# Patient Record
Sex: Male | Born: 1965
Health system: Southern US, Community
[De-identification: ages and names within clinical notes are randomized; demographics above are authoritative.]

## PROBLEM LIST (undated history)

## (undated) DIAGNOSIS — B20 Human immunodeficiency virus [HIV] disease: Secondary | ICD-10-CM

## (undated) DIAGNOSIS — B019 Varicella without complication: Secondary | ICD-10-CM

## (undated) DIAGNOSIS — Z21 Asymptomatic human immunodeficiency virus [HIV] infection status: Secondary | ICD-10-CM

## (undated) HISTORY — PX: WISDOM TOOTH EXTRACTION: SHX21

## (undated) HISTORY — DX: Asymptomatic human immunodeficiency virus (hiv) infection status: Z21

## (undated) HISTORY — DX: Human immunodeficiency virus (HIV) disease: B20

## (undated) HISTORY — DX: Varicella without complication: B01.9

---

## 1996-07-11 ENCOUNTER — Encounter (INDEPENDENT_AMBULATORY_CARE_PROVIDER_SITE_OTHER): Payer: Self-pay | Admitting: *Deleted

## 1996-07-11 LAB — CONVERTED CEMR LAB: CD4 Count: 20 microliters

## 1997-08-22 ENCOUNTER — Encounter: Admission: RE | Admit: 1997-08-22 | Discharge: 1997-08-22 | Payer: Self-pay | Admitting: Infectious Diseases

## 1997-09-07 ENCOUNTER — Encounter: Admission: RE | Admit: 1997-09-07 | Discharge: 1997-09-07 | Payer: Self-pay | Admitting: Infectious Diseases

## 1997-11-09 ENCOUNTER — Encounter: Admission: RE | Admit: 1997-11-09 | Discharge: 1997-11-09 | Payer: Self-pay | Admitting: Hematology and Oncology

## 1997-12-21 ENCOUNTER — Encounter: Admission: RE | Admit: 1997-12-21 | Discharge: 1997-12-21 | Payer: Self-pay | Admitting: Infectious Diseases

## 1998-01-04 ENCOUNTER — Encounter: Admission: RE | Admit: 1998-01-04 | Discharge: 1998-01-04 | Payer: Self-pay | Admitting: Infectious Diseases

## 1998-03-06 ENCOUNTER — Ambulatory Visit (HOSPITAL_COMMUNITY): Admission: RE | Admit: 1998-03-06 | Discharge: 1998-03-06 | Payer: Self-pay | Admitting: Infectious Diseases

## 1998-03-06 ENCOUNTER — Encounter: Admission: RE | Admit: 1998-03-06 | Discharge: 1998-03-06 | Payer: Self-pay | Admitting: Infectious Diseases

## 1998-04-12 ENCOUNTER — Encounter: Admission: RE | Admit: 1998-04-12 | Discharge: 1998-04-12 | Payer: Self-pay | Admitting: Infectious Diseases

## 1998-06-08 ENCOUNTER — Encounter: Admission: RE | Admit: 1998-06-08 | Discharge: 1998-06-08 | Payer: Self-pay | Admitting: Infectious Diseases

## 1998-06-08 ENCOUNTER — Ambulatory Visit (HOSPITAL_COMMUNITY): Admission: RE | Admit: 1998-06-08 | Discharge: 1998-06-08 | Payer: Self-pay | Admitting: Infectious Diseases

## 1998-06-21 ENCOUNTER — Encounter: Admission: RE | Admit: 1998-06-21 | Discharge: 1998-06-21 | Payer: Self-pay | Admitting: Infectious Diseases

## 1998-08-16 ENCOUNTER — Ambulatory Visit (HOSPITAL_COMMUNITY): Admission: RE | Admit: 1998-08-16 | Discharge: 1998-08-16 | Payer: Self-pay | Admitting: Infectious Diseases

## 1998-08-30 ENCOUNTER — Encounter: Admission: RE | Admit: 1998-08-30 | Discharge: 1998-08-30 | Payer: Self-pay | Admitting: Infectious Diseases

## 1998-11-30 ENCOUNTER — Encounter: Admission: RE | Admit: 1998-11-30 | Discharge: 1998-11-30 | Payer: Self-pay | Admitting: Infectious Diseases

## 1998-11-30 ENCOUNTER — Ambulatory Visit (HOSPITAL_COMMUNITY): Admission: RE | Admit: 1998-11-30 | Discharge: 1998-11-30 | Payer: Self-pay | Admitting: Infectious Diseases

## 1998-12-13 ENCOUNTER — Encounter: Admission: RE | Admit: 1998-12-13 | Discharge: 1998-12-13 | Payer: Self-pay | Admitting: Infectious Diseases

## 1999-03-14 ENCOUNTER — Ambulatory Visit (HOSPITAL_COMMUNITY): Admission: RE | Admit: 1999-03-14 | Discharge: 1999-03-14 | Payer: Self-pay | Admitting: Infectious Diseases

## 1999-03-14 ENCOUNTER — Encounter: Admission: RE | Admit: 1999-03-14 | Discharge: 1999-03-14 | Payer: Self-pay | Admitting: Infectious Diseases

## 1999-05-02 ENCOUNTER — Encounter: Admission: RE | Admit: 1999-05-02 | Discharge: 1999-05-02 | Payer: Self-pay | Admitting: Infectious Diseases

## 1999-06-06 ENCOUNTER — Encounter: Admission: RE | Admit: 1999-06-06 | Discharge: 1999-06-06 | Payer: Self-pay | Admitting: Infectious Diseases

## 1999-06-13 ENCOUNTER — Encounter: Admission: RE | Admit: 1999-06-13 | Discharge: 1999-06-13 | Payer: Self-pay | Admitting: Infectious Diseases

## 1999-06-27 ENCOUNTER — Encounter: Admission: RE | Admit: 1999-06-27 | Discharge: 1999-06-27 | Payer: Self-pay | Admitting: Infectious Diseases

## 1999-08-08 ENCOUNTER — Encounter: Admission: RE | Admit: 1999-08-08 | Discharge: 1999-08-08 | Payer: Self-pay | Admitting: Infectious Diseases

## 1999-10-03 ENCOUNTER — Encounter: Admission: RE | Admit: 1999-10-03 | Discharge: 1999-10-03 | Payer: Self-pay | Admitting: Infectious Diseases

## 1999-10-03 ENCOUNTER — Ambulatory Visit (HOSPITAL_COMMUNITY): Admission: RE | Admit: 1999-10-03 | Discharge: 1999-10-03 | Payer: Self-pay | Admitting: Infectious Diseases

## 1999-10-24 ENCOUNTER — Encounter: Admission: RE | Admit: 1999-10-24 | Discharge: 1999-10-24 | Payer: Self-pay | Admitting: Infectious Diseases

## 1999-12-26 ENCOUNTER — Encounter: Admission: RE | Admit: 1999-12-26 | Discharge: 1999-12-26 | Payer: Self-pay | Admitting: Infectious Diseases

## 1999-12-26 ENCOUNTER — Ambulatory Visit (HOSPITAL_COMMUNITY): Admission: RE | Admit: 1999-12-26 | Discharge: 1999-12-26 | Payer: Self-pay | Admitting: Infectious Diseases

## 2000-01-09 ENCOUNTER — Encounter: Admission: RE | Admit: 2000-01-09 | Discharge: 2000-01-09 | Payer: Self-pay | Admitting: Infectious Diseases

## 2000-03-19 ENCOUNTER — Encounter: Admission: RE | Admit: 2000-03-19 | Discharge: 2000-03-19 | Payer: Self-pay | Admitting: Infectious Diseases

## 2000-03-19 ENCOUNTER — Ambulatory Visit (HOSPITAL_COMMUNITY): Admission: RE | Admit: 2000-03-19 | Discharge: 2000-03-19 | Payer: Self-pay | Admitting: Infectious Diseases

## 2001-02-10 ENCOUNTER — Ambulatory Visit (HOSPITAL_COMMUNITY): Admission: RE | Admit: 2001-02-10 | Discharge: 2001-02-10 | Payer: Self-pay | Admitting: Infectious Diseases

## 2001-02-10 ENCOUNTER — Encounter: Admission: RE | Admit: 2001-02-10 | Discharge: 2001-02-10 | Payer: Self-pay | Admitting: Infectious Diseases

## 2001-02-24 ENCOUNTER — Encounter: Admission: RE | Admit: 2001-02-24 | Discharge: 2001-02-24 | Payer: Self-pay | Admitting: Infectious Diseases

## 2001-03-16 ENCOUNTER — Ambulatory Visit (HOSPITAL_COMMUNITY): Admission: RE | Admit: 2001-03-16 | Discharge: 2001-03-16 | Payer: Self-pay | Admitting: Infectious Diseases

## 2001-03-16 ENCOUNTER — Encounter: Admission: RE | Admit: 2001-03-16 | Discharge: 2001-03-16 | Payer: Self-pay | Admitting: Infectious Diseases

## 2001-04-14 ENCOUNTER — Encounter: Admission: RE | Admit: 2001-04-14 | Discharge: 2001-04-14 | Payer: Self-pay | Admitting: Infectious Diseases

## 2001-10-19 ENCOUNTER — Ambulatory Visit (HOSPITAL_COMMUNITY): Admission: RE | Admit: 2001-10-19 | Discharge: 2001-10-19 | Payer: Self-pay | Admitting: Infectious Diseases

## 2001-10-19 ENCOUNTER — Encounter: Admission: RE | Admit: 2001-10-19 | Discharge: 2001-10-19 | Payer: Self-pay | Admitting: Infectious Diseases

## 2001-11-02 ENCOUNTER — Encounter: Admission: RE | Admit: 2001-11-02 | Discharge: 2001-11-02 | Payer: Self-pay | Admitting: Infectious Diseases

## 2001-12-09 ENCOUNTER — Encounter: Admission: RE | Admit: 2001-12-09 | Discharge: 2001-12-09 | Payer: Self-pay | Admitting: Internal Medicine

## 2002-12-06 ENCOUNTER — Encounter: Admission: RE | Admit: 2002-12-06 | Discharge: 2002-12-06 | Payer: Self-pay | Admitting: Infectious Diseases

## 2002-12-06 ENCOUNTER — Encounter (INDEPENDENT_AMBULATORY_CARE_PROVIDER_SITE_OTHER): Payer: Self-pay | Admitting: Infectious Diseases

## 2002-12-06 ENCOUNTER — Ambulatory Visit (HOSPITAL_COMMUNITY): Admission: RE | Admit: 2002-12-06 | Discharge: 2002-12-06 | Payer: Self-pay | Admitting: Infectious Diseases

## 2002-12-20 ENCOUNTER — Encounter: Admission: RE | Admit: 2002-12-20 | Discharge: 2002-12-20 | Payer: Self-pay | Admitting: Infectious Diseases

## 2003-03-21 ENCOUNTER — Encounter: Admission: RE | Admit: 2003-03-21 | Discharge: 2003-03-21 | Payer: Self-pay | Admitting: Infectious Diseases

## 2003-03-21 ENCOUNTER — Ambulatory Visit (HOSPITAL_COMMUNITY): Admission: RE | Admit: 2003-03-21 | Discharge: 2003-03-21 | Payer: Self-pay | Admitting: Infectious Diseases

## 2003-03-21 ENCOUNTER — Encounter (INDEPENDENT_AMBULATORY_CARE_PROVIDER_SITE_OTHER): Payer: Self-pay | Admitting: Infectious Diseases

## 2003-04-04 ENCOUNTER — Encounter: Admission: RE | Admit: 2003-04-04 | Discharge: 2003-04-04 | Payer: Self-pay | Admitting: Infectious Diseases

## 2004-07-31 ENCOUNTER — Encounter (INDEPENDENT_AMBULATORY_CARE_PROVIDER_SITE_OTHER): Payer: Self-pay | Admitting: *Deleted

## 2004-07-31 ENCOUNTER — Ambulatory Visit: Payer: Self-pay | Admitting: Infectious Diseases

## 2004-07-31 ENCOUNTER — Ambulatory Visit (HOSPITAL_COMMUNITY): Admission: RE | Admit: 2004-07-31 | Discharge: 2004-07-31 | Payer: Self-pay | Admitting: Infectious Diseases

## 2004-07-31 LAB — CONVERTED CEMR LAB: CD4 Count: 70 microliters

## 2004-08-13 ENCOUNTER — Ambulatory Visit: Payer: Self-pay | Admitting: Infectious Diseases

## 2004-10-23 ENCOUNTER — Ambulatory Visit: Payer: Self-pay | Admitting: Infectious Diseases

## 2004-10-23 ENCOUNTER — Encounter (INDEPENDENT_AMBULATORY_CARE_PROVIDER_SITE_OTHER): Payer: Self-pay | Admitting: *Deleted

## 2004-10-23 ENCOUNTER — Ambulatory Visit (HOSPITAL_COMMUNITY): Admission: RE | Admit: 2004-10-23 | Discharge: 2004-10-23 | Payer: Self-pay | Admitting: Infectious Diseases

## 2004-10-23 LAB — CONVERTED CEMR LAB
CD4 Count: 160 microliters
HIV 1 RNA Quant: 399 copies/mL

## 2004-11-06 ENCOUNTER — Ambulatory Visit: Payer: Self-pay | Admitting: Infectious Diseases

## 2006-01-01 ENCOUNTER — Encounter: Admission: RE | Admit: 2006-01-01 | Discharge: 2006-01-01 | Payer: Self-pay | Admitting: Infectious Diseases

## 2006-01-01 ENCOUNTER — Ambulatory Visit: Payer: Self-pay | Admitting: Infectious Diseases

## 2006-01-01 ENCOUNTER — Encounter (INDEPENDENT_AMBULATORY_CARE_PROVIDER_SITE_OTHER): Payer: Self-pay | Admitting: *Deleted

## 2006-01-01 LAB — CONVERTED CEMR LAB: HIV 1 RNA Quant: 118000 copies/mL

## 2006-01-27 ENCOUNTER — Ambulatory Visit (HOSPITAL_COMMUNITY): Admission: RE | Admit: 2006-01-27 | Discharge: 2006-01-27 | Payer: Self-pay | Admitting: Infectious Diseases

## 2006-01-27 ENCOUNTER — Ambulatory Visit: Payer: Self-pay | Admitting: Infectious Diseases

## 2006-01-29 ENCOUNTER — Ambulatory Visit: Payer: Self-pay | Admitting: Infectious Diseases

## 2006-02-05 ENCOUNTER — Ambulatory Visit: Payer: Self-pay | Admitting: Infectious Diseases

## 2006-06-17 ENCOUNTER — Encounter (INDEPENDENT_AMBULATORY_CARE_PROVIDER_SITE_OTHER): Payer: Self-pay | Admitting: *Deleted

## 2006-06-17 ENCOUNTER — Encounter: Admission: RE | Admit: 2006-06-17 | Discharge: 2006-06-17 | Payer: Self-pay | Admitting: Infectious Diseases

## 2006-06-17 ENCOUNTER — Ambulatory Visit: Payer: Self-pay | Admitting: Infectious Diseases

## 2006-06-17 LAB — CONVERTED CEMR LAB
Albumin: 4.3 g/dL (ref 3.5–5.2)
Alkaline Phosphatase: 67 units/L (ref 39–117)
Bilirubin Urine: NEGATIVE
CO2: 26 meq/L (ref 19–32)
Chloride: 103 meq/L (ref 96–112)
Cholesterol: 152 mg/dL (ref 0–200)
Glucose, Bld: 81 mg/dL (ref 70–99)
HIV 1 RNA Quant: 523 copies/mL — ABNORMAL HIGH (ref <50)
HIV-1 RNA Quant, Log: 2.72 — ABNORMAL HIGH (ref <1.70)
LDL Cholesterol: 96 mg/dL (ref 0–99)
Lymphocytes Relative: 21 % (ref 12–46)
Lymphs Abs: 1.4 10*3/uL (ref 0.7–3.3)
Neutro Abs: 4.4 10*3/uL (ref 1.7–7.7)
Neutrophils Relative %: 67 % (ref 43–77)
Platelets: 195 10*3/uL (ref 150–400)
Potassium: 4.1 meq/L (ref 3.5–5.3)
Sodium: 141 meq/L (ref 135–145)
Specific Gravity, Urine: 1.015 (ref 1.005–1.03)
Total Protein: 7.1 g/dL (ref 6.0–8.3)
Triglycerides: 119 mg/dL (ref <150)
Urobilinogen, UA: 1 (ref 0.0–1.0)
WBC: 6.5 10*3/uL (ref 4.0–10.5)

## 2006-07-07 ENCOUNTER — Encounter (INDEPENDENT_AMBULATORY_CARE_PROVIDER_SITE_OTHER): Payer: Self-pay | Admitting: *Deleted

## 2006-07-07 LAB — CONVERTED CEMR LAB

## 2006-07-15 DIAGNOSIS — B351 Tinea unguium: Secondary | ICD-10-CM

## 2006-07-15 DIAGNOSIS — B1710 Acute hepatitis C without hepatic coma: Secondary | ICD-10-CM

## 2006-07-15 DIAGNOSIS — B20 Human immunodeficiency virus [HIV] disease: Secondary | ICD-10-CM | POA: Insufficient documentation

## 2006-07-15 DIAGNOSIS — Z8709 Personal history of other diseases of the respiratory system: Secondary | ICD-10-CM | POA: Insufficient documentation

## 2006-07-15 DIAGNOSIS — K130 Diseases of lips: Secondary | ICD-10-CM

## 2006-07-15 DIAGNOSIS — B370 Candidal stomatitis: Secondary | ICD-10-CM

## 2006-07-20 ENCOUNTER — Encounter (INDEPENDENT_AMBULATORY_CARE_PROVIDER_SITE_OTHER): Payer: Self-pay | Admitting: *Deleted

## 2006-07-21 ENCOUNTER — Ambulatory Visit: Payer: Self-pay | Admitting: Infectious Diseases

## 2006-10-24 ENCOUNTER — Encounter (INDEPENDENT_AMBULATORY_CARE_PROVIDER_SITE_OTHER): Payer: Self-pay | Admitting: Infectious Diseases

## 2006-10-24 ENCOUNTER — Ambulatory Visit: Payer: Self-pay | Admitting: Internal Medicine

## 2006-10-24 ENCOUNTER — Encounter: Admission: RE | Admit: 2006-10-24 | Discharge: 2006-10-24 | Payer: Self-pay | Admitting: Infectious Diseases

## 2006-11-03 ENCOUNTER — Encounter (INDEPENDENT_AMBULATORY_CARE_PROVIDER_SITE_OTHER): Payer: Self-pay | Admitting: *Deleted

## 2006-11-10 ENCOUNTER — Ambulatory Visit: Payer: Self-pay | Admitting: Infectious Diseases

## 2006-11-10 ENCOUNTER — Encounter: Payer: Self-pay | Admitting: Internal Medicine

## 2006-12-08 ENCOUNTER — Telehealth: Payer: Self-pay | Admitting: Infectious Disease

## 2006-12-23 ENCOUNTER — Ambulatory Visit: Payer: Self-pay | Admitting: Internal Medicine

## 2007-01-22 ENCOUNTER — Encounter: Admission: RE | Admit: 2007-01-22 | Discharge: 2007-01-22 | Payer: Self-pay | Admitting: Internal Medicine

## 2007-01-22 ENCOUNTER — Ambulatory Visit: Payer: Self-pay | Admitting: Internal Medicine

## 2007-01-22 LAB — CONVERTED CEMR LAB
ALT: 23 units/L (ref 0–53)
AST: 21 units/L (ref 0–37)
Albumin: 4.6 g/dL (ref 3.5–5.2)
Alkaline Phosphatase: 79 units/L (ref 39–117)
BUN: 14 mg/dL (ref 6–23)
Basophils Absolute: 0.1 10*3/uL (ref 0.0–0.1)
Basophils Relative: 1 % (ref 0–1)
CO2: 23 meq/L (ref 19–32)
Calcium: 9 mg/dL (ref 8.4–10.5)
Chloride: 105 meq/L (ref 96–112)
Creatinine, Ser: 1.04 mg/dL (ref 0.40–1.50)
Eosinophils Absolute: 0.2 10*3/uL (ref 0.0–0.7)
Eosinophils Relative: 3 % (ref 0–5)
Glucose, Bld: 76 mg/dL (ref 70–99)
HCT: 43.2 % (ref 39.0–52.0)
HIV 1 RNA Quant: 528 copies/mL — ABNORMAL HIGH (ref <50)
HIV-1 RNA Quant, Log: 2.72 — ABNORMAL HIGH (ref <1.70)
Hemoglobin: 15.7 g/dL (ref 13.0–17.0)
Lymphocytes Relative: 25 % (ref 12–46)
Lymphs Abs: 1.5 10*3/uL (ref 0.7–3.3)
MCHC: 36.3 g/dL — ABNORMAL HIGH (ref 30.0–36.0)
MCV: 92.1 fL (ref 78.0–100.0)
Monocytes Absolute: 0.5 10*3/uL (ref 0.2–0.7)
Monocytes Relative: 8 % (ref 3–11)
Neutro Abs: 3.9 10*3/uL (ref 1.7–7.7)
Neutrophils Relative %: 64 % (ref 43–77)
Platelets: 206 10*3/uL (ref 150–400)
Potassium: 3.9 meq/L (ref 3.5–5.3)
RBC: 4.69 M/uL (ref 4.22–5.81)
RDW: 13.7 % (ref 11.5–14.0)
Sodium: 139 meq/L (ref 135–145)
Total Bilirubin: 0.5 mg/dL (ref 0.3–1.2)
Total Protein: 7 g/dL (ref 6.0–8.3)
WBC: 6.1 10*3/uL (ref 4.0–10.5)

## 2007-02-06 ENCOUNTER — Ambulatory Visit: Payer: Self-pay | Admitting: Internal Medicine

## 2007-10-13 ENCOUNTER — Encounter: Admission: RE | Admit: 2007-10-13 | Discharge: 2007-10-13 | Payer: Self-pay | Admitting: Internal Medicine

## 2007-10-13 ENCOUNTER — Ambulatory Visit: Payer: Self-pay | Admitting: Internal Medicine

## 2007-10-13 LAB — CONVERTED CEMR LAB
AST: 28 units/L (ref 0–37)
Albumin: 4 g/dL (ref 3.5–5.2)
BUN: 15 mg/dL (ref 6–23)
CO2: 24 meq/L (ref 19–32)
Calcium: 8.6 mg/dL (ref 8.4–10.5)
Chloride: 108 meq/L (ref 96–112)
Creatinine, Ser: 1.06 mg/dL (ref 0.40–1.50)
Eosinophils Relative: 3 % (ref 0–5)
Glucose, Bld: 76 mg/dL (ref 70–99)
HCT: 38.1 % — ABNORMAL LOW (ref 39.0–52.0)
HIV-1 RNA Quant, Log: 5.09 — ABNORMAL HIGH (ref <1.70)
Hemoglobin: 13.4 g/dL (ref 13.0–17.0)
Lymphocytes Relative: 32 % (ref 12–46)
Lymphs Abs: 1.1 10*3/uL (ref 0.7–4.0)
Monocytes Absolute: 0.5 10*3/uL (ref 0.1–1.0)
Monocytes Relative: 14 % — ABNORMAL HIGH (ref 3–12)
Neutro Abs: 1.6 10*3/uL — ABNORMAL LOW (ref 1.7–7.7)
Potassium: 4 meq/L (ref 3.5–5.3)
RBC: 4.09 M/uL — ABNORMAL LOW (ref 4.22–5.81)
RDW: 13.8 % (ref 11.5–15.5)
Testosterone: 288.11 ng/dL — ABNORMAL LOW (ref 350–890)
WBC: 3.3 10*3/uL — ABNORMAL LOW (ref 4.0–10.5)

## 2007-10-27 ENCOUNTER — Ambulatory Visit: Payer: Self-pay | Admitting: Internal Medicine

## 2007-10-27 DIAGNOSIS — R599 Enlarged lymph nodes, unspecified: Secondary | ICD-10-CM | POA: Insufficient documentation

## 2007-10-27 LAB — CONVERTED CEMR LAB

## 2007-11-20 ENCOUNTER — Ambulatory Visit: Payer: Self-pay | Admitting: Internal Medicine

## 2007-12-04 ENCOUNTER — Ambulatory Visit: Payer: Self-pay | Admitting: Internal Medicine

## 2008-02-12 IMAGING — CR DG CHEST 2V
2 series · 2 of 2 positions shown · non-contrast
Comparison: none

CLINICAL DATA: Productive cough, low grade fever for ten days.
 CHEST - 2 VIEWS:

[w chest pa]
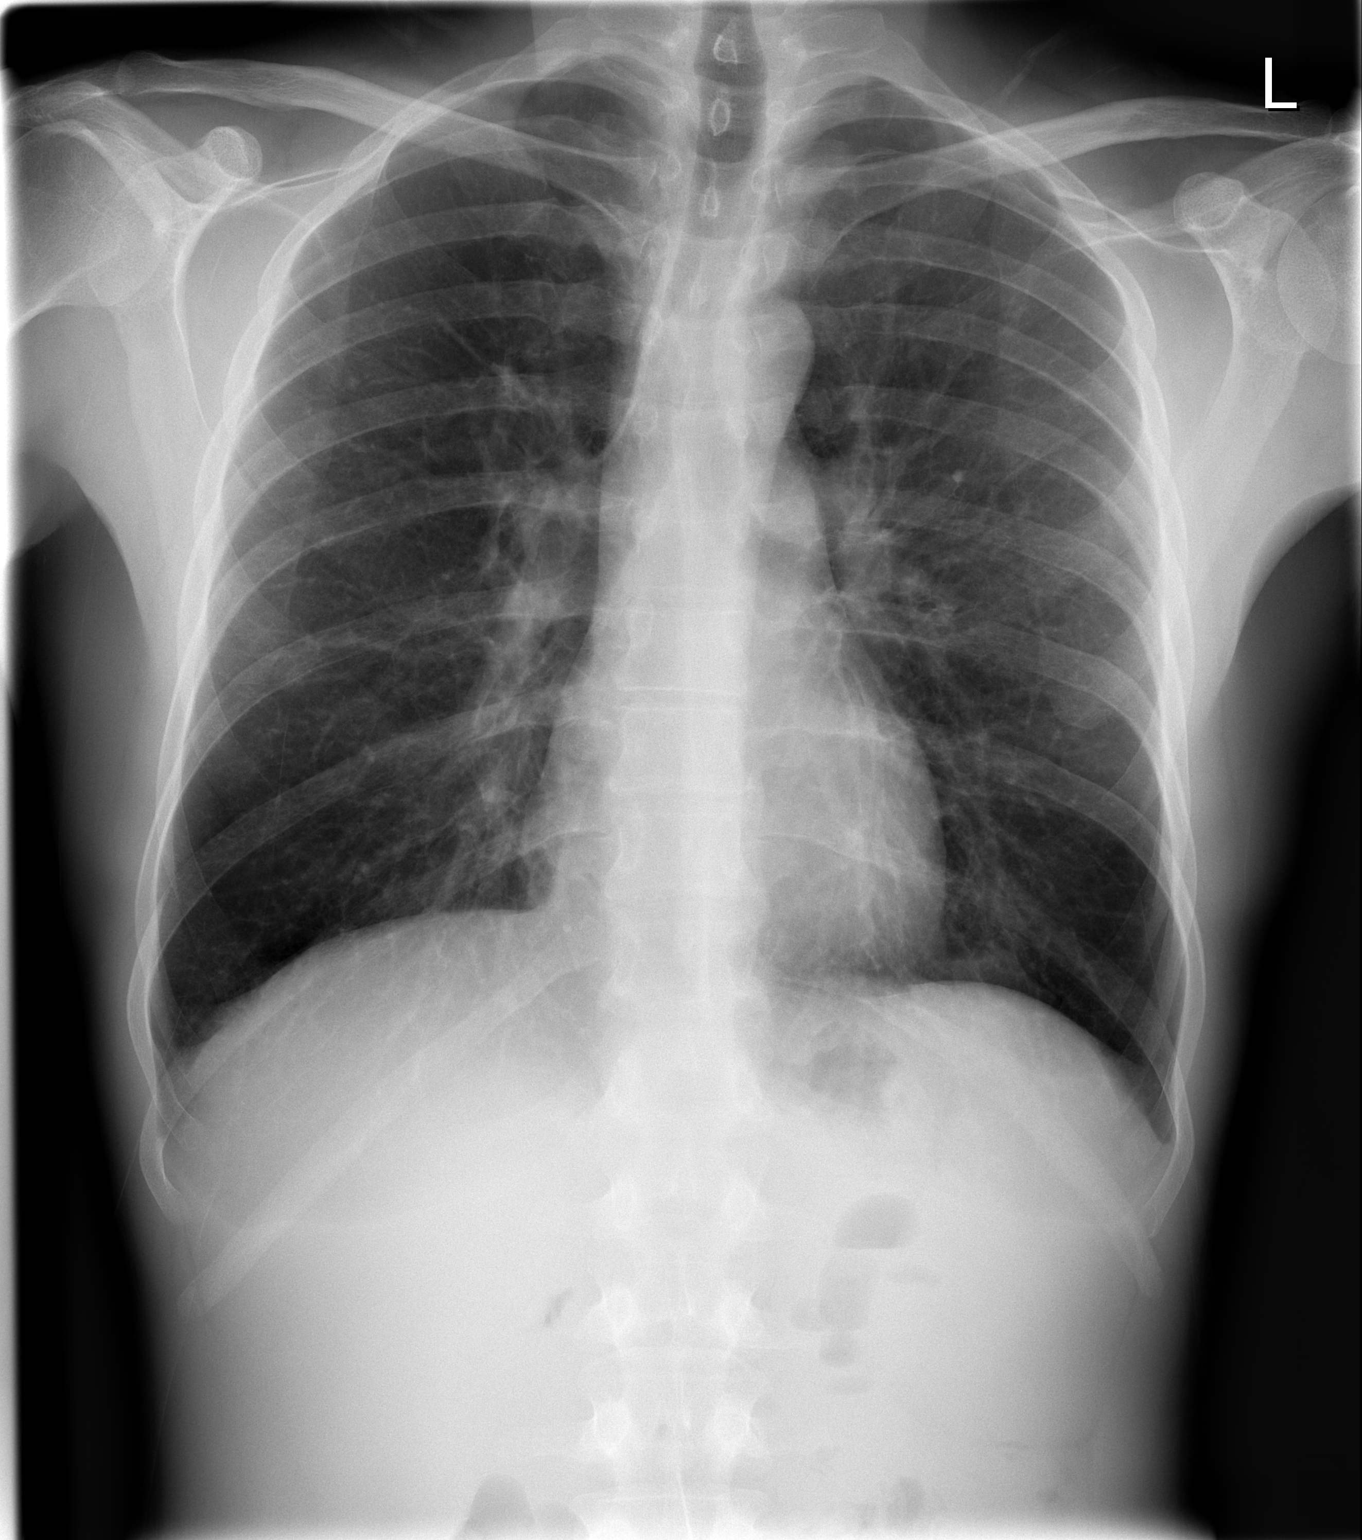

[w chest lat]
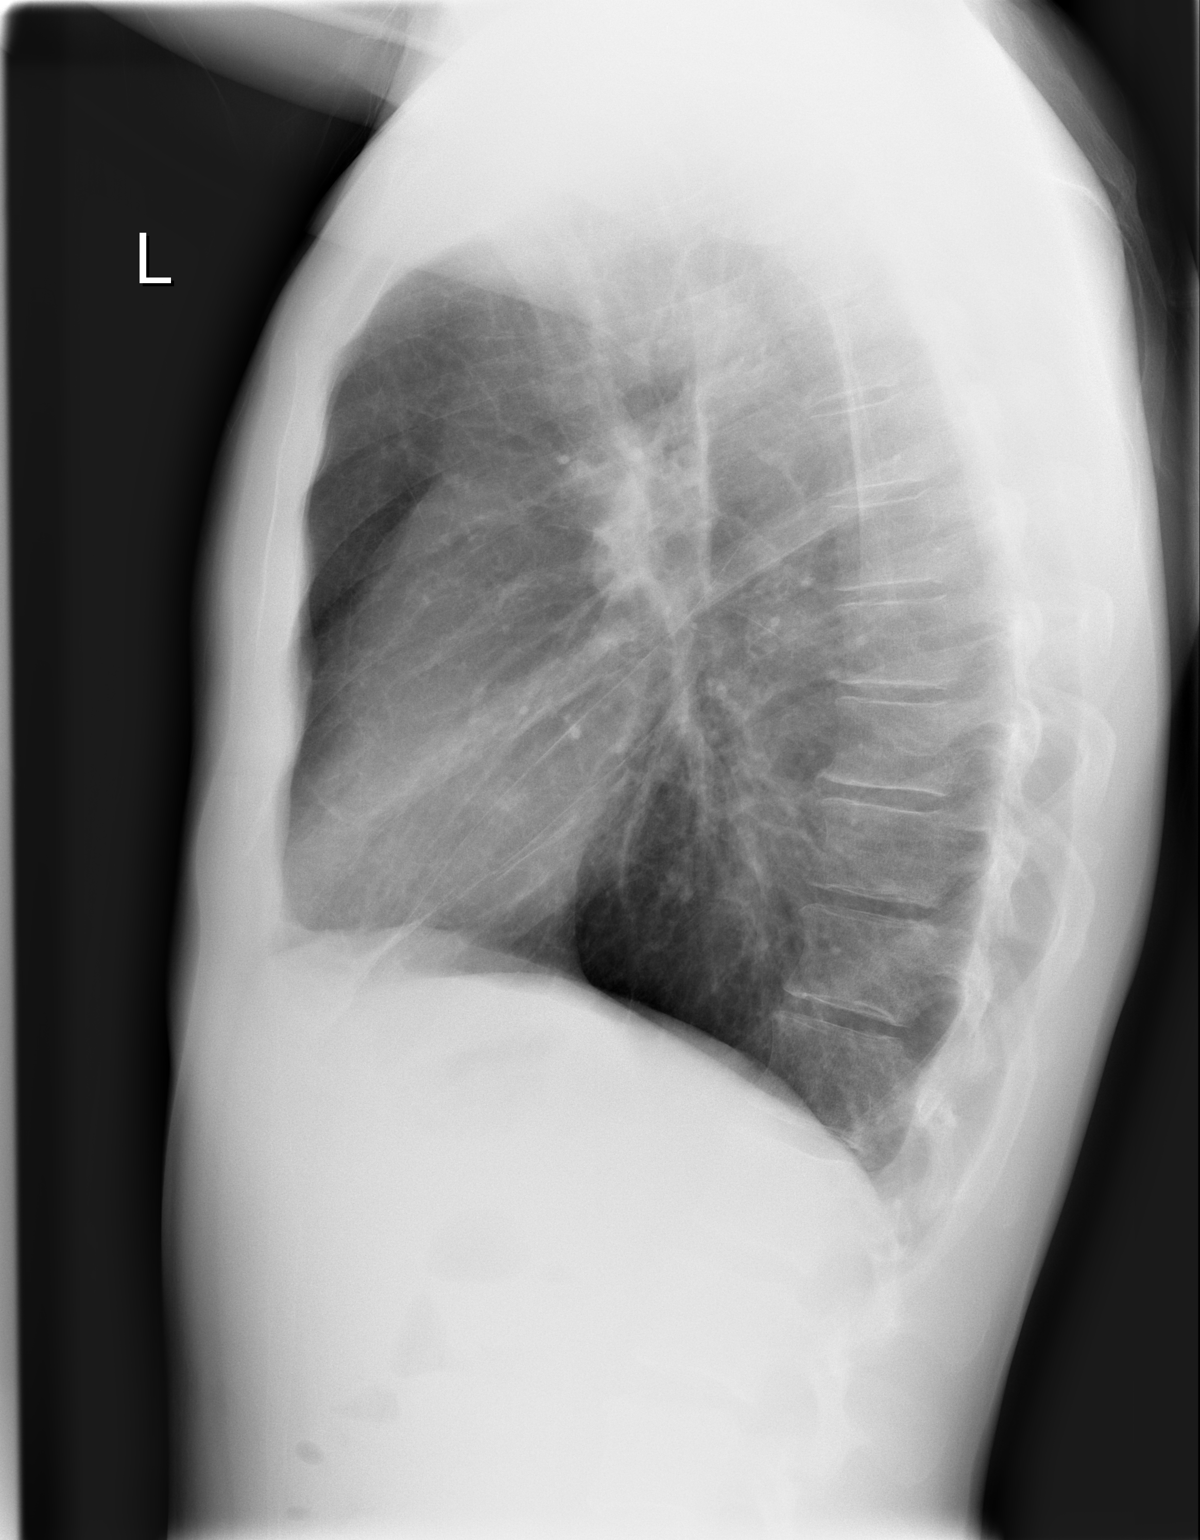

[2 of 2 positions shown; findings below may reference images not displayed]

FINDINGS: The heart size is normal.   The mediastinum is unremarkable.  The lungs are clear.   No effusions.  No soft tissue or bony abnormalities.
IMPRESSION: No active disease.

## 2008-08-19 ENCOUNTER — Ambulatory Visit: Payer: Self-pay | Admitting: Internal Medicine

## 2008-08-19 LAB — CONVERTED CEMR LAB
ALT: 19 units/L (ref 0–53)
AST: 21 units/L (ref 0–37)
BUN: 14 mg/dL (ref 6–23)
CO2: 23 meq/L (ref 19–32)
Calcium: 9.3 mg/dL (ref 8.4–10.5)
Chloride: 106 meq/L (ref 96–112)
Creatinine, Ser: 1.14 mg/dL (ref 0.40–1.50)
GFR calc Af Amer: >60 mL/min (ref >60)
HIV 1 RNA Quant: 1140 copies/mL — ABNORMAL HIGH (ref <48)
HIV-1 RNA Quant, Log: 3.06 — ABNORMAL HIGH (ref <1.68)
Hemoglobin: 16.7 g/dL (ref 13.0–17.0)
Lymphs Abs: 2.1 10*3/uL (ref 0.7–4.0)
Monocytes Absolute: 0.9 10*3/uL (ref 0.1–1.0)
Monocytes Relative: 10 % (ref 3–12)
Neutro Abs: 5.9 10*3/uL (ref 1.7–7.7)
Neutrophils Relative %: 65 % (ref 43–77)
RBC: 5.12 M/uL (ref 4.22–5.81)
Total Bilirubin: 0.4 mg/dL (ref 0.3–1.2)
WBC: 9.2 10*3/uL (ref 4.0–10.5)

## 2008-09-02 ENCOUNTER — Ambulatory Visit: Payer: Self-pay | Admitting: Internal Medicine

## 2008-09-16 ENCOUNTER — Telehealth (INDEPENDENT_AMBULATORY_CARE_PROVIDER_SITE_OTHER): Payer: Self-pay | Admitting: *Deleted

## 2008-12-02 ENCOUNTER — Ambulatory Visit: Payer: Self-pay | Admitting: Internal Medicine

## 2008-12-02 LAB — CONVERTED CEMR LAB
ALT: 15 units/L (ref 0–53)
AST: 17 units/L (ref 0–37)
Albumin: 4.3 g/dL (ref 3.5–5.2)
Alkaline Phosphatase: 68 units/L (ref 39–117)
Basophils Absolute: 0 10*3/uL (ref 0.0–0.1)
Basophils Relative: 1 % (ref 0–1)
Chloride: 106 meq/L (ref 96–112)
Hemoglobin: 15.9 g/dL (ref 13.0–17.0)
MCHC: 34.9 g/dL (ref 30.0–36.0)
Monocytes Absolute: 0.6 10*3/uL (ref 0.1–1.0)
Neutro Abs: 4.7 10*3/uL (ref 1.7–7.7)
Neutrophils Relative %: 65 % (ref 43–77)
Platelets: 151 10*3/uL (ref 150–400)
Potassium: 3.9 meq/L (ref 3.5–5.3)
RDW: 13.9 % (ref 11.5–15.5)
Sodium: 140 meq/L (ref 135–145)
Total Protein: 6.7 g/dL (ref 6.0–8.3)

## 2008-12-16 ENCOUNTER — Ambulatory Visit: Payer: Self-pay | Admitting: Internal Medicine

## 2009-01-11 ENCOUNTER — Ambulatory Visit: Payer: Self-pay | Admitting: Internal Medicine

## 2009-02-22 ENCOUNTER — Ambulatory Visit: Payer: Self-pay | Admitting: Internal Medicine

## 2009-02-22 LAB — CONVERTED CEMR LAB
BUN: 14 mg/dL (ref 6–23)
CO2: 22 meq/L (ref 19–32)
Calcium: 8.8 mg/dL (ref 8.4–10.5)
Chloride: 106 meq/L (ref 96–112)
Cholesterol: 167 mg/dL (ref 0–200)
Creatinine, Ser: 1.05 mg/dL (ref 0.40–1.50)
Eosinophils Absolute: 0.3 10*3/uL (ref 0.0–0.7)
Eosinophils Relative: 4 % (ref 0–5)
HCT: 46.8 % (ref 39.0–52.0)
HDL: 25 mg/dL — ABNORMAL LOW (ref >39)
Lymphs Abs: 2 10*3/uL (ref 0.7–4.0)
MCV: 97.5 fL (ref >=78.0)
Monocytes Relative: 10 % (ref 3–12)
Platelets: 164 10*3/uL (ref 150–400)
Total CHOL/HDL Ratio: 6.7
WBC: 6.5 10*3/uL (ref 4.0–10.5)

## 2009-03-15 ENCOUNTER — Ambulatory Visit: Payer: Self-pay | Admitting: Internal Medicine

## 2009-06-14 ENCOUNTER — Ambulatory Visit: Payer: Self-pay | Admitting: Internal Medicine

## 2009-06-14 LAB — CONVERTED CEMR LAB
Albumin: 4.2 g/dL (ref 3.5–5.2)
Alkaline Phosphatase: 63 units/L (ref 39–117)
BUN: 13 mg/dL (ref 6–23)
Basophils Absolute: 0 10*3/uL (ref 0.0–0.1)
Basophils Relative: 1 % (ref 0–1)
Eosinophils Relative: 2 % (ref 0–5)
Glucose, Bld: 96 mg/dL (ref 70–99)
HCT: 44.9 % (ref 39.0–52.0)
HIV 1 RNA Quant: 454 copies/mL — ABNORMAL HIGH (ref <48)
HIV-1 RNA Quant, Log: 2.66 — ABNORMAL HIGH (ref <1.68)
Hemoglobin: 15.6 g/dL (ref 13.0–17.0)
Lymphocytes Relative: 21 % (ref 12–46)
MCHC: 34.7 g/dL (ref 30.0–36.0)
Monocytes Absolute: 0.8 10*3/uL (ref 0.1–1.0)
Potassium: 4.1 meq/L (ref 3.5–5.3)
RDW: 13.5 % (ref 11.5–15.5)

## 2009-06-28 ENCOUNTER — Ambulatory Visit: Payer: Self-pay | Admitting: Internal Medicine

## 2009-09-26 ENCOUNTER — Ambulatory Visit: Payer: Self-pay | Admitting: Internal Medicine

## 2009-09-26 LAB — CONVERTED CEMR LAB
ALT: 15 units/L (ref 0–53)
AST: 18 units/L (ref 0–37)
Albumin: 4.5 g/dL (ref 3.5–5.2)
Alkaline Phosphatase: 70 units/L (ref 39–117)
Calcium: 9.7 mg/dL (ref 8.4–10.5)
Chloride: 103 meq/L (ref 96–112)
Eosinophils Relative: 3 % (ref 0–5)
HCT: 47.9 % (ref 39.0–52.0)
Lymphocytes Relative: 23 % (ref 12–46)
Lymphs Abs: 1.8 10*3/uL (ref 0.7–4.0)
Neutro Abs: 5.6 10*3/uL (ref 1.7–7.7)
Neutrophils Relative %: 69 % (ref 43–77)
Platelets: 201 10*3/uL (ref 150–400)
Potassium: 4.5 meq/L (ref 3.5–5.3)
Sodium: 139 meq/L (ref 135–145)
WBC: 8.1 10*3/uL (ref 4.0–10.5)

## 2009-10-11 ENCOUNTER — Ambulatory Visit: Payer: Self-pay | Admitting: Internal Medicine

## 2010-04-11 ENCOUNTER — Ambulatory Visit: Payer: Self-pay | Admitting: Internal Medicine

## 2010-04-11 LAB — CONVERTED CEMR LAB
Albumin: 4.6 g/dL (ref 3.5–5.2)
Alkaline Phosphatase: 76 units/L (ref 39–117)
BUN: 12 mg/dL (ref 6–23)
Basophils Relative: 0 % (ref 0–1)
CO2: 24 meq/L (ref 19–32)
Glucose, Bld: 89 mg/dL (ref 70–99)
HIV 1 RNA Quant: <20 copies/mL (ref <20)
HIV-1 RNA Quant, Log: <1.3 (ref <1.30)
Hemoglobin: 17.2 g/dL — ABNORMAL HIGH (ref 13.0–17.0)
Lymphs Abs: 1.9 10*3/uL (ref 0.7–4.0)
MCHC: 34.4 g/dL (ref 30.0–36.0)
MCV: 101.4 fL — ABNORMAL HIGH (ref 78.0–100.0)
Monocytes Absolute: 0.7 10*3/uL (ref 0.1–1.0)
Monocytes Relative: 7 % (ref 3–12)
Neutro Abs: 7.3 10*3/uL (ref 1.7–7.7)
Neutrophils Relative %: 72 % (ref 43–77)
RBC: 4.93 M/uL (ref 4.22–5.81)
Total Bilirubin: 0.6 mg/dL (ref 0.3–1.2)
WBC: 10.1 10*3/uL (ref 4.0–10.5)

## 2010-05-18 ENCOUNTER — Ambulatory Visit
Admission: RE | Admit: 2010-05-18 | Discharge: 2010-05-18 | Payer: Self-pay | Source: Home / Self Care | Attending: Internal Medicine | Admitting: Internal Medicine

## 2010-06-06 ENCOUNTER — Encounter (INDEPENDENT_AMBULATORY_CARE_PROVIDER_SITE_OTHER): Payer: Self-pay | Admitting: *Deleted

## 2010-06-10 LAB — CONVERTED CEMR LAB
ALT: 16 units/L (ref 0–53)
AST: 18 units/L (ref 0–37)
Albumin: 4.4 g/dL (ref 3.5–5.2)
Alkaline Phosphatase: 62 units/L (ref 39–117)
Basophils Absolute: 0 10*3/uL (ref 0.0–0.1)
Basophils Relative: 1 % (ref 0–1)
Calcium: 9.2 mg/dL (ref 8.4–10.5)
Chloride: 103 meq/L (ref 96–112)
MCHC: 35.7 g/dL (ref 30.0–36.0)
Neutro Abs: 4.5 10*3/uL (ref 1.7–7.7)
Neutrophils Relative %: 70 % (ref 43–77)
Platelets: 148 10*3/uL — ABNORMAL LOW (ref 150–400)
Potassium: 3.9 meq/L (ref 3.5–5.3)
RBC: 4.96 M/uL (ref 4.22–5.81)
RDW: 13.1 % (ref 11.5–14.0)
Sodium: 137 meq/L (ref 135–145)

## 2010-06-12 NOTE — Assessment & Plan Note (Signed)
Summary: 60month f/u/vs   CC:  follow-up visit and lab results.  History of Present Illness: Pt here for f/u.  H has been under some stress with his Mother hospitalized with an infection. No missed doses of his HIV meds.  Preventive Screening-Counseling & Management  Alcohol-Tobacco     Alcohol drinks/day: 1     Alcohol type: mixed drinks     Smoking Status: current     Smoking Cessation Counseling: yes     Packs/Day: 1.0     Passive Smoke Exposure: yes  Caffeine-Diet-Exercise     Caffeine use/day: coffee     Does Patient Exercise: yes     Type of exercise: gym      Exercise (avg: min/session): 30-60     Times/week: 3  Hep-HIV-STD-Contraception     HIV Risk: yes     HIV Risk Counseling: 11/06/2004  Safety-Violence-Falls     Seat Belt Use: yes      Sexual History:  currently monogamous.        Drug Use:  never.        Blood Transfusions:  no.        Travel History:  none.    Comments: pt. declined condoms   Updated Prior Medication List: VIREAD 300 MG  TABS (TENOFOVIR DISOPROXIL FUMARATE) Take 1 tablet by mouth once a day EPZICOM 600-300 MG  TABS (ABACAVIR SULFATE-LAMIVUDINE) Take 1 tablet by mouth once a day PREZISTA 600 MG TABS (DARUNAVIR ETHANOLATE) Take 1 tablet by mouth two times a day NORVIR 100 MG TABS (RITONAVIR) Take 1 tablet by mouth two times a day  Current Allergies (reviewed today): No known allergies  Past History:  Past Medical History: Last updated: 07/15/2006 HIV disease Pneumonia, hx of Hepatitis C Oral thrush Cheilosis Tinea ungium  Review of Systems  The patient denies anorexia, fever, and weight loss.    Vital Signs:  Patient profile:   45 year old male Height:      68 inches (172.72 cm) Weight:      136.12 pounds (61.87 kg) BMI:     20.77 Temp:     97.5 degrees F (36.39 degrees C) oral Pulse rate:   71 / minute BP sitting:   113 / 69  (right arm)  Vitals Entered By: Wendall Mola CMA Duncan Dull) (October 11, 2009 9:18  AM) CC: follow-up visit, lab results Is Patient Diabetic? No Pain Assessment Patient in pain? no      Nutritional Status BMI of 19 -24 = normal Nutritional Status Detail appetite "fine"  Does patient need assistance? Functional Status Self care Ambulation Normal Comments no missed doses of meds per patient   Physical Exam  General:  alert, well-developed, well-nourished, and well-hydrated.   Head:  normocephalic and atraumatic.   Mouth:  pharynx pink and moist.   Lungs:  normal breath sounds.      Impression & Recommendations:  Problem # 1:  HIV DISEASE (ICD-042) Pt.s most recent CD4ct was 210 and VL <48. This is the first time in several years that he is undetectable .  Pt instructed to continue the current antiretroviral regimen.  Pt encouraged to take medication regularly and not miss doses.  Pt will f/u in 3 months for repeat blood work and will see me 2 weeks later.  Diagnostics Reviewed:  HIV: CDC-defined AIDS (09/02/2008)   CD4: 210 (09/27/2009)   WBC: 8.1 (09/26/2009)   Hgb: 16.6 (09/26/2009)   HCT: 47.9 (09/26/2009)   Platelets: 201 (09/26/2009) HIV  genotype: * (12/16/2008)   HIV-1 RNA: <48 copies/mL (09/26/2009)   HBSAg: No (07/07/2006)  Other Orders: Est. Patient Level III (60454) Future Orders: T-CD4SP ([facility]) (CD4SP) ... 01/09/2010 T-HIV Viral Load [id number]) ... 01/09/2010 T-Comprehensive Metabolic Panel [phone]) ... 01/09/2010 T-CBC w/Diff ([id number]) ... 01/09/2010 T-Hepatitis A Antibody ([id number]) ... 01/09/2010  Patient Instructions: 1)  Please schedule a follow-up appointment in 3 months, 2 weeks after labs.

## 2010-06-12 NOTE — Assessment & Plan Note (Signed)
Summary: F/U/VS   CC:  f/u labs.  History of Present Illness: Pt doing well. No missed doses of his HIV meds.  Preventive Screening-Counseling & Management  Alcohol-Tobacco     Alcohol drinks/day: 1     Alcohol type: mixed drinks     Smoking Status: current     Smoking Cessation Counseling: yes     Packs/Day: 1.0     Passive Smoke Exposure: yes  Caffeine-Diet-Exercise     Caffeine use/day: coffee     Does Patient Exercise: yes     Type of exercise: gym      Exercise (avg: min/session): 30-60     Times/week: 3   Updated Prior Medication List: SEPTRA DS 800-160 MG TABS (SULFAMETHOXAZOLE-TRIMETHOPRIM) Take 1 tablet by mouth once a day VIREAD 300 MG  TABS (TENOFOVIR DISOPROXIL FUMARATE) Take 1 tablet by mouth once a day EPZICOM 600-300 MG  TABS (ABACAVIR SULFATE-LAMIVUDINE) Take 1 tablet by mouth once a day PREZISTA 600 MG TABS (DARUNAVIR ETHANOLATE) Take 1 tablet by mouth two times a day NORVIR 100 MG TABS (RITONAVIR) Take 1 tablet by mouth two times a day  Current Allergies (reviewed today): No known allergies  Past History:  Past Medical History: Last updated: 07/15/2006 HIV disease Pneumonia, hx of Hepatitis C Oral thrush Cheilosis Tinea ungium  Review of Systems  The patient denies anorexia, fever, and weight loss.    Vital Signs:  Patient profile:   45 year old male Height:      68 inches (172.72 cm) Weight:      138 pounds (62.73 kg) BMI:     21.06 Temp:     97.6 degrees F (36.44 degrees C) oral Pulse rate:   75 / minute BP sitting:   117 / 69  (right arm)  Vitals Entered By: Starleen Arms CMA (June 28, 2009 9:11 AM) CC: f/u labs Is Patient Diabetic? No Pain Assessment Patient in pain? no      Nutritional Status BMI of 19 -24 = normal Nutritional Status Detail nl  Does patient need assistance? Functional Status Self care Ambulation Normal   Physical Exam  General:  alert, well-developed, well-nourished, and well-hydrated.    Head:  normocephalic and atraumatic.   Mouth:  pharynx pink and moist.   Lungs:  normal breath sounds.      Impression & Recommendations:  Problem # 1:  HIV DISEASE (ICD-042) Pt.s most recent CD4ct was 240 and VL 454 .  Pt instructed to continue the current antiretroviral regimen.  Pt encouraged to take medication regularly and not miss doses.  Pt will f/u in 3 months for repeat blood work and will see me 2 weeks later.  Diagnostics Reviewed:  HIV: CDC-defined AIDS (09/02/2008)   CD4: 240 (06/15/2009)   WBC: 8.5 (06/14/2009)   Hgb: 15.6 (06/14/2009)   HCT: 44.9 (06/14/2009)   Platelets: 171 (06/14/2009) HIV genotype: * (12/16/2008)   HIV-1 RNA: 454 (06/14/2009)   HBSAg: No (07/07/2006)  Other Orders: Est. Patient Level III (56433) Future Orders: T-CD4SP ([facility]) (CD4SP) ... 09/26/2009 T-HIV Viral Load [id number]) ... 09/26/2009 T-Comprehensive Metabolic Panel [id number]) ... 09/26/2009 T-CBC w/Diff ([id number]) ... 09/26/2009  Patient Instructions: 1)  Please schedule a follow-up appointment in 3 months, 2 weeks after labs.

## 2010-06-14 NOTE — Assessment & Plan Note (Signed)
Summary: 57month f/u [mkj]   CC:  follow-up visit and lab results.  History of Present Illness: patient is here for follow-up of his lab results.  His been feeling well.  His been taking his medication every day.  No new problems.  Preventive Screening-Counseling & Management  Alcohol-Tobacco     Alcohol drinks/day: 1     Alcohol type: mixed drinks     Smoking Status: current     Smoking Cessation Counseling: yes     Packs/Day: 1.0     Passive Smoke Exposure: yes  Caffeine-Diet-Exercise     Caffeine use/day: coffee     Does Patient Exercise: yes     Type of exercise: gym      Exercise (avg: min/session): 30-60     Times/week: 3  Hep-HIV-STD-Contraception     HIV Risk: yes     HIV Risk Counseling: 11/06/2004  Safety-Violence-Falls     Seat Belt Use: yes      Sexual History:  currently monogamous.        Drug Use:  never.        Blood Transfusions:  no.        Travel History:  none.    Comments: pt. declined condoms   Updated Prior Medication List: VIREAD 300 MG  TABS (TENOFOVIR DISOPROXIL FUMARATE) Take 1 tablet by mouth once a day EPZICOM 600-300 MG  TABS (ABACAVIR SULFATE-LAMIVUDINE) Take 1 tablet by mouth once a day PREZISTA 600 MG TABS (DARUNAVIR ETHANOLATE) Take 1 tablet by mouth two times a day NORVIR 100 MG TABS (RITONAVIR) Take 1 tablet by mouth two times a day  Current Allergies (reviewed today): No known allergies  Past History:  Past Medical History: Last updated: 07/15/2006 HIV disease Pneumonia, hx of Hepatitis C Oral thrush Cheilosis Tinea ungium  Review of Systems  The patient denies anorexia, fever, and weight loss.    Vital Signs:  Patient profile:   45 year old male Height:      68 inches (172.72 cm) Weight:      139.8 pounds (63.55 kg) BMI:     21.33 Temp:     97.9 degrees F (36.61 degrees C) oral Pulse rate:   73 / minute BP sitting:   114 / 71  (right arm)  Vitals Entered By: Wendall Mola CMA Duncan Dull) (May 18, 2010 3:50 PM) CC: follow-up visit, lab results Is Patient Diabetic? No Pain Assessment Patient in pain? no      Nutritional Status BMI of 19 -24 = normal Nutritional Status Detail appetite "fine"  Have you ever been in a relationship where you felt threatened, hurt or afraid?No   Does patient need assistance? Functional Status Self care Ambulation Normal Comments no missed doses of meds per pt.   Physical Exam  General:  alert, well-developed, well-nourished, and well-hydrated.   Head:  normocephalic and atraumatic.   Mouth:  pharynx pink and moist.   Lungs:  normal breath sounds.     Impression & Recommendations:  Problem # 1:  HIV DISEASE (ICD-042) Pt.s most recent CD4ct was 280 and VL <20 .  Pt instructed to continue the current antiretroviral regimen.  Pt encouraged to take medication regularly and not miss doses.  Pt will f/u in 3 months for repeat blood work and will see me 2 weeks later.  Influenza vaccine and Hep A vaccine # 1 given today.  Diagnostics Reviewed:  HIV: CDC-defined AIDS (09/02/2008)   CD4: 280 (04/12/2010)   WBC: 10.1 (04/11/2010)  Hgb: 17.2 (04/11/2010)   HCT: 50.0 (04/11/2010)   Platelets: 174 (04/11/2010) HIV genotype: * (12/16/2008)   HIV-1 RNA: <20 copies/mL (04/11/2010)   HBSAg: No (07/07/2006)  Other Orders: Est. Patient Level III (16109) Influenza Vaccine NON MCR (60454) Hepatitis A Vaccine (Adult Dose) (09811) Admin 1st Vaccine (91478) Future Orders: T-CD4SP ([facility]) (CD4SP) ... 08/16/2010 T-HIV Viral Load [id number]) ... 08/16/2010 T-Comprehensive Metabolic Panel [id number]) ... 08/16/2010 T-CBC w/Diff ([id number]) ... 08/16/2010 T-RPR (Syphilis) [id number]) ... 08/16/2010 T-Lipid Profile [phone]) ... 08/16/2010  Patient Instructions: 1)  Please schedule a follow-up appointment in 3 months, 2 weeks after labs.       Immunizations Administered:  Influenza Vaccine # 1:    Vaccine Type: Fluvax Non-MCR    Site:  left deltoid    Mfr: Novartis    Dose: 0.5 ml    Route: IM    Given by: Wendall Mola CMA ( AAMA)    Exp. Date: 08/12/2010    Lot #: [id number]    VIS given: 12/05/09 version given May 18, 2010.  Hepatitis A Vaccine # 1:    Vaccine Type: HepA    Site: right deltoid    Mfr: GlaxoSmithKline    Dose: 0.5 ml    Route: IM    Given by: Wendall Mola CMA ( AAMA)    Exp. Date: 04/25/2012    Lot #: [id number]    VIS given: 07/31/04 version given May 18, 2010.  Flu Vaccine Consent Questions:    Do you have a history of severe allergic reactions to this vaccine? no    Any prior history of allergic reactions to egg and/or gelatin? no    Do you have a sensitivity to the preservative Thimersol? no    Do you have a past history of Guillan-Barre Syndrome? no    Do you currently have an acute febrile illness? no    Have you ever had a severe reaction to latex? no    Vaccine information given and explained to patient? yes

## 2010-06-14 NOTE — Miscellaneous (Signed)
  Clinical Lists Changes  Observations: Added new observation of INCOMESOURCE: UNKNOWN (06/06/2010 13:22) Added new observation of HOUSEINCOME: 0  (06/06/2010 13:22) Added new observation of HOUSING: Stable/permanent  (06/06/2010 13:22) Added new observation of YEARLYEXPEN: 0  (06/06/2010 13:22)

## 2010-07-24 LAB — T-HELPER CELL (CD4) - (RCID CLINIC ONLY): CD4 % Helper T Cell: 14 % — ABNORMAL LOW (ref 33–55)

## 2010-07-30 LAB — T-HELPER CELL (CD4) - (RCID CLINIC ONLY)
CD4 % Helper T Cell: 15 % — ABNORMAL LOW (ref 33–55)
CD4 T Cell Abs: 210 uL — ABNORMAL LOW (ref 400–2700)

## 2010-08-15 ENCOUNTER — Other Ambulatory Visit: Payer: Self-pay | Admitting: *Deleted

## 2010-08-15 DIAGNOSIS — B20 Human immunodeficiency virus [HIV] disease: Secondary | ICD-10-CM

## 2010-08-15 MED ORDER — DARUNAVIR ETHANOLATE 600 MG PO TABS: 600.0000 mg | ORAL_TABLET | Freq: Two times a day (BID) | ORAL | Status: DC

## 2010-08-15 MED ORDER — ABACAVIR SULFATE-LAMIVUDINE 600-300 MG PO TABS: 1.0000 | ORAL_TABLET | Freq: Every day | ORAL | Status: DC

## 2010-08-15 MED ORDER — TENOFOVIR DISOPROXIL FUMARATE 300 MG PO TABS: 300.0000 mg | ORAL_TABLET | Freq: Every day | ORAL | Status: DC

## 2010-08-15 MED ORDER — RITONAVIR 100 MG PO TABS: 100.0000 mg | ORAL_TABLET | Freq: Two times a day (BID) | ORAL | Status: DC

## 2010-08-16 ENCOUNTER — Other Ambulatory Visit: Payer: Self-pay | Admitting: Licensed Clinical Social Worker

## 2010-08-16 DIAGNOSIS — B20 Human immunodeficiency virus [HIV] disease: Secondary | ICD-10-CM

## 2010-08-16 LAB — T-HELPER CELL (CD4) - (RCID CLINIC ONLY)
CD4 % Helper T Cell: 13 % — ABNORMAL LOW (ref 33–55)
CD4 T Cell Abs: 250 uL — ABNORMAL LOW (ref 400–2700)

## 2010-08-16 MED ORDER — TENOFOVIR DISOPROXIL FUMARATE 300 MG PO TABS: 300.0000 mg | ORAL_TABLET | Freq: Every day | ORAL | Status: DC

## 2010-08-16 MED ORDER — RITONAVIR 100 MG PO TABS: 100.0000 mg | ORAL_TABLET | Freq: Two times a day (BID) | ORAL | Status: DC

## 2010-08-16 MED ORDER — DARUNAVIR ETHANOLATE 600 MG PO TABS: 600.0000 mg | ORAL_TABLET | Freq: Two times a day (BID) | ORAL | Status: DC

## 2010-08-19 LAB — T-HELPER CELL (CD4) - (RCID CLINIC ONLY)
CD4 % Helper T Cell: 13 % — ABNORMAL LOW (ref 33–55)
CD4 T Cell Abs: 230 uL — ABNORMAL LOW (ref 400–2700)

## 2010-08-22 LAB — T-HELPER CELL (CD4) - (RCID CLINIC ONLY): CD4 T Cell Abs: 220 uL — ABNORMAL LOW (ref 400–2700)

## 2011-02-07 LAB — T-HELPER CELL (CD4) - (RCID CLINIC ONLY)
CD4 % Helper T Cell: 11 — ABNORMAL LOW
CD4 T Cell Abs: 100 — ABNORMAL LOW

## 2011-02-22 LAB — T-HELPER CELL (CD4) - (RCID CLINIC ONLY)
CD4 % Helper T Cell: 16 — ABNORMAL LOW
CD4 T Cell Abs: 220 — ABNORMAL LOW

## 2011-02-28 LAB — T-HELPER CELL (CD4) - (RCID CLINIC ONLY)
CD4 % Helper T Cell: 14 — ABNORMAL LOW
CD4 T Cell Abs: 160 — ABNORMAL LOW

## 2011-08-19 ENCOUNTER — Telehealth: Payer: Self-pay | Admitting: *Deleted

## 2011-08-19 NOTE — Telephone Encounter (Signed)
His pharmacy called for refills. We have not seen him in over a year. He had told the pharmacist he was seeing a new md. I gave them the name & numbers for Dr. Philipp Deputy. Told her if she is not seeing the pt, ask him to call here asap

## 2011-08-22 ENCOUNTER — Other Ambulatory Visit: Payer: Self-pay | Admitting: *Deleted

## 2011-08-22 DIAGNOSIS — B20 Human immunodeficiency virus [HIV] disease: Secondary | ICD-10-CM

## 2011-08-22 MED ORDER — RITONAVIR 100 MG PO TABS: 100.0000 mg | ORAL_TABLET | Freq: Two times a day (BID) | ORAL | Status: DC

## 2011-08-22 MED ORDER — ABACAVIR SULFATE-LAMIVUDINE 600-300 MG PO TABS: 1.0000 | ORAL_TABLET | Freq: Every day | ORAL | Status: DC

## 2011-08-22 MED ORDER — DARUNAVIR ETHANOLATE 600 MG PO TABS: 600.0000 mg | ORAL_TABLET | Freq: Two times a day (BID) | ORAL | Status: DC

## 2011-08-22 MED ORDER — TENOFOVIR DISOPROXIL FUMARATE 300 MG PO TABS: 300.0000 mg | ORAL_TABLET | Freq: Every day | ORAL | Status: DC

## 2011-08-26 ENCOUNTER — Other Ambulatory Visit: Payer: Self-pay | Admitting: Internal Medicine

## 2011-08-26 ENCOUNTER — Other Ambulatory Visit (INDEPENDENT_AMBULATORY_CARE_PROVIDER_SITE_OTHER): Payer: 59

## 2011-08-26 DIAGNOSIS — Z79899 Other long term (current) drug therapy: Secondary | ICD-10-CM

## 2011-08-26 DIAGNOSIS — Z113 Encounter for screening for infections with a predominantly sexual mode of transmission: Secondary | ICD-10-CM

## 2011-08-26 DIAGNOSIS — B20 Human immunodeficiency virus [HIV] disease: Secondary | ICD-10-CM

## 2011-08-26 LAB — CBC WITH DIFFERENTIAL/PLATELET
Basophils Absolute: 0 10*3/uL (ref 0.0–0.1)
Eosinophils Absolute: 0.2 10*3/uL (ref 0.0–0.7)
Eosinophils Relative: 2 % (ref 0–5)
MCH: 35 pg — ABNORMAL HIGH (ref 26.0–34.0)
MCV: 100.5 fL — ABNORMAL HIGH (ref 78.0–100.0)
Platelets: 192 10*3/uL (ref 150–400)
RDW: 13 % (ref 11.5–15.5)

## 2011-08-26 LAB — COMPREHENSIVE METABOLIC PANEL
ALT: 16 U/L (ref 0–53)
AST: 19 U/L (ref 0–37)
CO2: 25 mEq/L (ref 19–32)
Creat: 1.22 mg/dL (ref 0.50–1.35)
Total Bilirubin: 0.4 mg/dL (ref 0.3–1.2)

## 2011-08-26 LAB — LIPID PANEL
HDL: 38 mg/dL — ABNORMAL LOW (ref >39)
LDL Cholesterol: 135 mg/dL — ABNORMAL HIGH (ref 0–99)
Total CHOL/HDL Ratio: 5.2 Ratio
Triglycerides: 122 mg/dL (ref <150)
VLDL: 24 mg/dL (ref 0–40)

## 2011-08-26 LAB — URINALYSIS, ROUTINE W REFLEX MICROSCOPIC
Bilirubin Urine: NEGATIVE
Ketones, ur: NEGATIVE mg/dL
Protein, ur: NEGATIVE mg/dL
Urobilinogen, UA: 0.2 mg/dL (ref 0.0–1.0)

## 2011-08-27 LAB — T-HELPER CELL (CD4) - (RCID CLINIC ONLY): CD4 % Helper T Cell: 14 % — ABNORMAL LOW (ref 33–55)

## 2011-08-27 LAB — RPR

## 2011-09-09 ENCOUNTER — Ambulatory Visit (INDEPENDENT_AMBULATORY_CARE_PROVIDER_SITE_OTHER): Payer: 59 | Admitting: Internal Medicine

## 2011-09-09 ENCOUNTER — Encounter: Payer: Self-pay | Admitting: Internal Medicine

## 2011-09-09 VITALS — BP 121/78 | HR 80 | Temp 97.9°F | Wt 135.0 lb

## 2011-09-09 DIAGNOSIS — B1920 Unspecified viral hepatitis C without hepatic coma: Secondary | ICD-10-CM

## 2011-09-09 DIAGNOSIS — B370 Candidal stomatitis: Secondary | ICD-10-CM

## 2011-09-09 DIAGNOSIS — B20 Human immunodeficiency virus [HIV] disease: Secondary | ICD-10-CM

## 2011-09-09 DIAGNOSIS — Z23 Encounter for immunization: Secondary | ICD-10-CM

## 2011-09-09 DIAGNOSIS — B1710 Acute hepatitis C without hepatic coma: Secondary | ICD-10-CM

## 2011-09-09 MED ORDER — DARUNAVIR ETHANOLATE 800 MG PO TABS: 800.0000 mg | ORAL_TABLET | Freq: Every day | ORAL | Status: DC

## 2011-09-09 MED ORDER — EMTRICITABINE-TENOFOVIR DF 200-300 MG PO TABS: 1.0000 | ORAL_TABLET | Freq: Every day | ORAL | Status: DC

## 2011-09-09 MED ORDER — RALTEGRAVIR POTASSIUM 400 MG PO TABS: 400.0000 mg | ORAL_TABLET | Freq: Two times a day (BID) | ORAL | Status: DC

## 2011-09-09 MED ORDER — RITONAVIR 100 MG PO TABS: 100.0000 mg | ORAL_TABLET | Freq: Every day | ORAL | Status: DC

## 2011-09-10 ENCOUNTER — Encounter: Payer: Self-pay | Admitting: Internal Medicine

## 2011-09-10 NOTE — Assessment & Plan Note (Signed)
Despite the long absence, he continues to do well. In review of his resistance mutations and has good compliance I have discussed with him his regimen and considering changing him to Isentress with once daily Prezista and Norvir and Truvada. The patient is agreeable and will start that with his next refill and return for labs about one month later. I also discussed with him that I am hopeful he has better immune reconstitution since his CD4 count has not elevated beyond the 200s. Condoms were offered.

## 2011-09-10 NOTE — Progress Notes (Signed)
  Subjective:    Patient ID: Trevor Leon, male    DOB: 10/20/1965, 46 y.o.   MRN: [id number]  HPI He comes in for followup of his HIV. He actually has been out of care for more than one year however he has been taking his medication as prescribed. In fact he continues to remain undetectable. He is on a regimen of Epzicom, tenofovir, and resist and Norvir and does have a history of resistance. Takes the medications well with no significant side effects. He has not been hospitalized and has no significant complaints.   Review of Systems  Constitutional: Negative for fever, chills, fatigue and unexpected weight change.  HENT: Negative for sore throat and trouble swallowing.   Respiratory: Negative for cough and shortness of breath.   Cardiovascular: Negative for chest pain, palpitations and leg swelling.  Gastrointestinal: Negative for nausea, abdominal pain and diarrhea.  Genitourinary: Negative for discharge.  Musculoskeletal: Negative for myalgias, joint swelling and arthralgias.  Skin: Negative for rash.  Neurological: Negative for dizziness, weakness and light-headedness.  Hematological: Negative for adenopathy.  Psychiatric/Behavioral: Negative for dysphoric mood. The patient is not nervous/anxious.        Objective:   Physical Exam  Constitutional: He appears well-developed and well-nourished. No distress.  HENT:  Mouth/Throat: No oropharyngeal exudate.  Cardiovascular: Normal rate, regular rhythm and normal heart sounds.  Exam reveals no gallop and no friction rub.   No murmur heard. Pulmonary/Chest: Effort normal and breath sounds normal. No respiratory distress. He has no wheezes. He has no rales.  Abdominal: Soft. Bowel sounds are normal. He exhibits no distension. There is no tenderness. There is no rebound.  Lymphadenopathy:    He has no cervical adenopathy.  Skin: Skin is warm and dry. No rash noted.  Psychiatric: He has a normal mood and affect. His behavior is  normal.          Assessment & Plan:

## 2011-09-10 NOTE — Assessment & Plan Note (Signed)
I do not see ahistory of an RNA level and we'll check that with his next lab visit.

## 2011-11-25 ENCOUNTER — Other Ambulatory Visit: Payer: 59

## 2011-11-25 DIAGNOSIS — B370 Candidal stomatitis: Secondary | ICD-10-CM

## 2011-11-25 DIAGNOSIS — B20 Human immunodeficiency virus [HIV] disease: Secondary | ICD-10-CM

## 2011-11-25 DIAGNOSIS — B1920 Unspecified viral hepatitis C without hepatic coma: Secondary | ICD-10-CM

## 2011-11-25 LAB — CBC WITH DIFFERENTIAL/PLATELET
HCT: 43.1 % (ref 39.0–52.0)
Hemoglobin: 15.4 g/dL (ref 13.0–17.0)
Lymphocytes Relative: 26 % (ref 12–46)
Lymphs Abs: 1.8 10*3/uL (ref 0.7–4.0)
Monocytes Absolute: 0.5 10*3/uL (ref 0.1–1.0)
Monocytes Relative: 6 % (ref 3–12)
Neutro Abs: 4.5 10*3/uL (ref 1.7–7.7)
RBC: 4.43 MIL/uL (ref 4.22–5.81)
WBC: 7.1 10*3/uL (ref 4.0–10.5)

## 2011-11-25 LAB — COMPREHENSIVE METABOLIC PANEL
Albumin: 4.3 g/dL (ref 3.5–5.2)
BUN: 13 mg/dL (ref 6–23)
Calcium: 9.1 mg/dL (ref 8.4–10.5)
Chloride: 106 mEq/L (ref 96–112)
Glucose, Bld: 84 mg/dL (ref 70–99)
Potassium: 4 mEq/L (ref 3.5–5.3)
Total Protein: 6.5 g/dL (ref 6.0–8.3)

## 2011-11-27 LAB — T-HELPER CELL (CD4) - (RCID CLINIC ONLY)
CD4 % Helper T Cell: 17 % — ABNORMAL LOW (ref 33–55)
CD4 T Cell Abs: 360 uL — ABNORMAL LOW (ref 400–2700)

## 2011-11-27 LAB — HEPATITIS C RNA QUANTITATIVE

## 2011-11-27 LAB — HIV-1 RNA QUANT-NO REFLEX-BLD: HIV-1 RNA Quant, Log: <1.3 {Log} (ref <1.30)

## 2011-12-09 ENCOUNTER — Ambulatory Visit (INDEPENDENT_AMBULATORY_CARE_PROVIDER_SITE_OTHER): Payer: 59 | Admitting: Internal Medicine

## 2011-12-09 ENCOUNTER — Encounter: Payer: Self-pay | Admitting: Internal Medicine

## 2011-12-09 VITALS — BP 117/74 | HR 79 | Temp 97.8°F | Ht 67.0 in | Wt 137.0 lb

## 2011-12-09 DIAGNOSIS — B1710 Acute hepatitis C without hepatic coma: Secondary | ICD-10-CM

## 2011-12-09 DIAGNOSIS — B20 Human immunodeficiency virus [HIV] disease: Secondary | ICD-10-CM

## 2011-12-09 NOTE — Assessment & Plan Note (Signed)
He does not have active hepatitis C with a negative virus and therefore no active disease.

## 2011-12-09 NOTE — Assessment & Plan Note (Signed)
He is doing well and his correction and with excellent compliance. In fact, his CD4 count has increased into the 300s for the first time in a long time. He feels well and no significant side effects. He can return to clinic in 6 months. He is going to establish with a primary care physician in [facility] as well. He was offered condoms. He does call if he has any new issues. He is going to get his flu shot from his new primary care physician this fall or noticed come here if he needs flu shot.

## 2011-12-09 NOTE — Progress Notes (Signed)
  Subjective:    Patient ID: Trevor Leon, male    DOB: January 12, 1966, 46 y.o.   MRN: [id number]  HPI This patient comes in for follow up of his HIV. He was last seen in April of this year after more than one year of no followup and does have a history of resistance mutations. At that visit, I streamline his regimen to Prezista, Norvir and Truvada daily with Isentress twice a day. He reports excellent compliance with no missed doses. He has previously had good compliance and did have an undetectable viral load for a number of years and this has continued. His CD4 count though has remained in the 200s. He has had no recent issues, no recent infections or hospitalizations.   Review of Systems  Constitutional: Negative for fever, chills, fatigue and unexpected weight change.  HENT: Negative for sore throat and trouble swallowing.   Respiratory: Negative for cough, shortness of breath and wheezing.   Cardiovascular: Negative for chest pain, palpitations and leg swelling.  Gastrointestinal: Negative for nausea, abdominal pain and diarrhea.  Musculoskeletal: Negative for myalgias, joint swelling and arthralgias.  Skin: Negative for rash.  Neurological: Negative for dizziness, weakness, light-headedness and headaches.  Hematological: Negative for adenopathy.  Psychiatric/Behavioral: Negative for dysphoric mood. The patient is not nervous/anxious.        Objective:   Physical Exam  Constitutional: He appears well-developed and well-nourished. No distress.  HENT:  Mouth/Throat: Oropharynx is clear and moist. No oropharyngeal exudate.  Cardiovascular: Normal rate, regular rhythm and normal heart sounds.  Exam reveals no gallop and no friction rub.   No murmur heard. Pulmonary/Chest: Effort normal and breath sounds normal. No respiratory distress. He has no wheezes. He has no rales.  Abdominal: Soft. Bowel sounds are normal. He exhibits no distension. There is no tenderness. There is no rebound.    Lymphadenopathy:    He has no cervical adenopathy.  Skin: Skin is warm and dry. No rash noted.          Assessment & Plan:

## 2012-05-14 ENCOUNTER — Other Ambulatory Visit: Payer: Self-pay | Admitting: Licensed Clinical Social Worker

## 2012-05-14 DIAGNOSIS — B20 Human immunodeficiency virus [HIV] disease: Secondary | ICD-10-CM

## 2012-05-14 MED ORDER — RITONAVIR 100 MG PO TABS: 100.0000 mg | ORAL_TABLET | Freq: Every day | ORAL | Status: DC

## 2012-05-14 MED ORDER — DARUNAVIR ETHANOLATE 800 MG PO TABS: 800.0000 mg | ORAL_TABLET | Freq: Every day | ORAL | Status: DC

## 2012-05-14 MED ORDER — EMTRICITABINE-TENOFOVIR DF 200-300 MG PO TABS: 1.0000 | ORAL_TABLET | Freq: Every day | ORAL | Status: DC

## 2012-05-14 MED ORDER — RALTEGRAVIR POTASSIUM 400 MG PO TABS: 400.0000 mg | ORAL_TABLET | Freq: Two times a day (BID) | ORAL | Status: DC

## 2012-05-15 ENCOUNTER — Other Ambulatory Visit: Payer: Self-pay | Admitting: Licensed Clinical Social Worker

## 2012-05-15 DIAGNOSIS — B20 Human immunodeficiency virus [HIV] disease: Secondary | ICD-10-CM

## 2012-05-15 MED ORDER — EMTRICITABINE-TENOFOVIR DF 200-300 MG PO TABS: 1.0000 | ORAL_TABLET | Freq: Every day | ORAL | Status: DC

## 2012-05-15 MED ORDER — RITONAVIR 100 MG PO TABS: 100.0000 mg | ORAL_TABLET | Freq: Every day | ORAL | Status: DC

## 2012-05-18 ENCOUNTER — Other Ambulatory Visit: Payer: Self-pay | Admitting: *Deleted

## 2012-05-18 DIAGNOSIS — B20 Human immunodeficiency virus [HIV] disease: Secondary | ICD-10-CM

## 2012-05-18 MED ORDER — DARUNAVIR ETHANOLATE 800 MG PO TABS: 800.0000 mg | ORAL_TABLET | Freq: Every day | ORAL | Status: DC

## 2012-05-18 MED ORDER — EMTRICITABINE-TENOFOVIR DF 200-300 MG PO TABS: 1.0000 | ORAL_TABLET | Freq: Every day | ORAL | Status: DC

## 2012-05-18 MED ORDER — RALTEGRAVIR POTASSIUM 400 MG PO TABS: 400.0000 mg | ORAL_TABLET | Freq: Two times a day (BID) | ORAL | Status: DC

## 2012-05-18 MED ORDER — RITONAVIR 100 MG PO TABS: 100.0000 mg | ORAL_TABLET | Freq: Every day | ORAL | Status: DC

## 2012-06-22 ENCOUNTER — Other Ambulatory Visit: Payer: Self-pay | Admitting: Licensed Clinical Social Worker

## 2012-06-22 DIAGNOSIS — B20 Human immunodeficiency virus [HIV] disease: Secondary | ICD-10-CM

## 2012-06-22 MED ORDER — RITONAVIR 100 MG PO TABS: 100.0000 mg | ORAL_TABLET | Freq: Every day | ORAL | Status: DC

## 2012-06-22 MED ORDER — RALTEGRAVIR POTASSIUM 400 MG PO TABS: 400.0000 mg | ORAL_TABLET | Freq: Two times a day (BID) | ORAL | Status: DC

## 2012-06-22 MED ORDER — DARUNAVIR ETHANOLATE 800 MG PO TABS: 800.0000 mg | ORAL_TABLET | Freq: Every day | ORAL | Status: DC

## 2012-06-22 MED ORDER — EMTRICITABINE-TENOFOVIR DF 200-300 MG PO TABS: 1.0000 | ORAL_TABLET | Freq: Every day | ORAL | Status: DC

## 2012-08-12 ENCOUNTER — Encounter: Payer: Self-pay | Admitting: *Deleted

## 2012-08-18 ENCOUNTER — Other Ambulatory Visit: Payer: 59

## 2012-08-18 ENCOUNTER — Other Ambulatory Visit: Payer: Self-pay | Admitting: Infectious Diseases

## 2012-08-18 DIAGNOSIS — B20 Human immunodeficiency virus [HIV] disease: Secondary | ICD-10-CM

## 2012-08-18 LAB — COMPLETE METABOLIC PANEL WITH GFR
Albumin: 4.6 g/dL (ref 3.5–5.2)
Alkaline Phosphatase: 62 U/L (ref 39–117)
Calcium: 9.3 mg/dL (ref 8.4–10.5)
Chloride: 103 mEq/L (ref 96–112)
GFR, Est Non African American: 85 mL/min
Glucose, Bld: 84 mg/dL (ref 70–99)
Potassium: 4.4 mEq/L (ref 3.5–5.3)
Sodium: 137 mEq/L (ref 135–145)
Total Protein: 6.7 g/dL (ref 6.0–8.3)

## 2012-08-18 LAB — CBC WITH DIFFERENTIAL/PLATELET
Eosinophils Relative: 4 % (ref 0–5)
HCT: 48.5 % (ref 39.0–52.0)
Lymphocytes Relative: 28 % (ref 12–46)
Lymphs Abs: 1.8 10*3/uL (ref 0.7–4.0)
MCV: 99 fL (ref 78.0–100.0)
Monocytes Absolute: 0.6 10*3/uL (ref 0.1–1.0)
RBC: 4.9 MIL/uL (ref 4.22–5.81)
WBC: 6.6 10*3/uL (ref 4.0–10.5)

## 2012-09-01 ENCOUNTER — Ambulatory Visit (INDEPENDENT_AMBULATORY_CARE_PROVIDER_SITE_OTHER): Payer: 59 | Admitting: Internal Medicine

## 2012-09-01 VITALS — BP 115/75 | HR 70 | Temp 98.6°F | Ht 67.0 in | Wt 135.0 lb

## 2012-09-01 DIAGNOSIS — B20 Human immunodeficiency virus [HIV] disease: Secondary | ICD-10-CM

## 2012-09-01 DIAGNOSIS — F17200 Nicotine dependence, unspecified, uncomplicated: Secondary | ICD-10-CM | POA: Insufficient documentation

## 2012-09-01 DIAGNOSIS — Z79899 Other long term (current) drug therapy: Secondary | ICD-10-CM

## 2012-09-01 DIAGNOSIS — Z113 Encounter for screening for infections with a predominantly sexual mode of transmission: Secondary | ICD-10-CM

## 2012-09-01 MED ORDER — DARUNAVIR ETHANOLATE 800 MG PO TABS: 800.0000 mg | ORAL_TABLET | Freq: Every day | ORAL | Status: DC

## 2012-09-01 MED ORDER — EMTRICITABINE-TENOFOVIR DF 200-300 MG PO TABS: 1.0000 | ORAL_TABLET | Freq: Every day | ORAL | Status: DC

## 2012-09-01 MED ORDER — RITONAVIR 100 MG PO TABS: 100.0000 mg | ORAL_TABLET | Freq: Every day | ORAL | Status: DC

## 2012-09-01 MED ORDER — RALTEGRAVIR POTASSIUM 400 MG PO TABS: 400.0000 mg | ORAL_TABLET | Freq: Two times a day (BID) | ORAL | Status: DC

## 2012-09-01 NOTE — Assessment & Plan Note (Signed)
He does continue to smoke and I did discuss smoking cessation. He is pre-contemplative at this time.

## 2012-09-01 NOTE — Progress Notes (Signed)
  Subjective:    Patient ID: Trevor Leon, male    DOB: Dec 29, 1965, 47 y.o.   MRN: [id number]  HPI He comes in for followup of his HIV. He has a history of resistance mutations including a 184 and 103. He is on a regimen of Prezista, norvir, truvada and Isentress.  He denies any missed doses and feels well overall. He continues to work in [employer] in does continue to smoke cigarettes. No recent hospitalizations or changes. He has not yet started seeing a primary care physician.   Review of Systems  Constitutional: Negative for fever, appetite change and fatigue.  HENT: Negative for sore throat and trouble swallowing.   Respiratory: Negative for cough and shortness of breath.   Cardiovascular: Negative for chest pain, palpitations and leg swelling.  Gastrointestinal: Negative for nausea, abdominal pain and diarrhea.  Musculoskeletal: Negative for myalgias, joint swelling and arthralgias.  Skin: Negative for rash.  Neurological: Negative for dizziness, light-headedness and headaches.  Hematological: Negative for adenopathy.       Objective:   Physical Exam  Constitutional: He appears well-developed and well-nourished. No distress.  HENT:  Mouth/Throat: Oropharynx is clear and moist. No oropharyngeal exudate.  Cardiovascular: Normal rate, regular rhythm and normal heart sounds.  Exam reveals no gallop and no friction rub.   No murmur heard. Pulmonary/Chest: Effort normal and breath sounds normal. No respiratory distress. He has no wheezes. He has no rales.  Abdominal: Soft. Bowel sounds are normal. He exhibits no distension. There is no tenderness.          Assessment & Plan:

## 2012-09-01 NOTE — Assessment & Plan Note (Signed)
He is doing well with his salvage regimen and remains nearly undetectable. He does have sporadic followup due to his work schedule. I will though have him scheduled for a 6 month appointment. I will refill his medications now as well.

## 2012-12-29 ENCOUNTER — Other Ambulatory Visit: Payer: Self-pay | Admitting: *Deleted

## 2012-12-29 DIAGNOSIS — B20 Human immunodeficiency virus [HIV] disease: Secondary | ICD-10-CM

## 2012-12-29 MED ORDER — RITONAVIR 100 MG PO TABS: 100.0000 mg | ORAL_TABLET | Freq: Every day | ORAL | Status: DC

## 2012-12-29 MED ORDER — RALTEGRAVIR POTASSIUM 400 MG PO TABS: 400.0000 mg | ORAL_TABLET | Freq: Two times a day (BID) | ORAL | Status: DC

## 2012-12-29 MED ORDER — DARUNAVIR ETHANOLATE 800 MG PO TABS: 800.0000 mg | ORAL_TABLET | Freq: Every day | ORAL | Status: DC

## 2012-12-29 MED ORDER — EMTRICITABINE-TENOFOVIR DF 200-300 MG PO TABS: 1.0000 | ORAL_TABLET | Freq: Every day | ORAL | Status: DC

## 2013-02-18 ENCOUNTER — Other Ambulatory Visit: Payer: BC Managed Care – PPO

## 2013-02-18 DIAGNOSIS — Z79899 Other long term (current) drug therapy: Secondary | ICD-10-CM

## 2013-02-18 DIAGNOSIS — B20 Human immunodeficiency virus [HIV] disease: Secondary | ICD-10-CM

## 2013-02-18 DIAGNOSIS — Z113 Encounter for screening for infections with a predominantly sexual mode of transmission: Secondary | ICD-10-CM

## 2013-02-18 LAB — COMPLETE METABOLIC PANEL WITH GFR
AST: 16 U/L (ref 0–37)
Albumin: 4.1 g/dL (ref 3.5–5.2)
BUN: 15 mg/dL (ref 6–23)
Calcium: 9 mg/dL (ref 8.4–10.5)
Chloride: 105 mEq/L (ref 96–112)
Creat: 1.08 mg/dL (ref 0.50–1.35)
GFR, Est African American: >89 mL/min
GFR, Est Non African American: 82 mL/min
Glucose, Bld: 93 mg/dL (ref 70–99)
Potassium: 4.1 mEq/L (ref 3.5–5.3)

## 2013-02-18 LAB — CBC WITH DIFFERENTIAL/PLATELET
Basophils Absolute: 0 10*3/uL (ref 0.0–0.1)
Eosinophils Relative: 2 % (ref 0–5)
HCT: 44.9 % (ref 39.0–52.0)
Hemoglobin: 15.9 g/dL (ref 13.0–17.0)
Lymphocytes Relative: 24 % (ref 12–46)
MCHC: 35.4 g/dL (ref 30.0–36.0)
MCV: 97.2 fL (ref 78.0–100.0)
Monocytes Absolute: 0.6 10*3/uL (ref 0.1–1.0)
Monocytes Relative: 7 % (ref 3–12)
Neutro Abs: 5.5 10*3/uL (ref 1.7–7.7)
RDW: 14.2 % (ref 11.5–15.5)
WBC: 8.2 10*3/uL (ref 4.0–10.5)

## 2013-02-18 LAB — LIPID PANEL: LDL Cholesterol: 110 mg/dL — ABNORMAL HIGH (ref 0–99)

## 2013-02-18 LAB — RPR

## 2013-02-19 LAB — T-HELPER CELL (CD4) - (RCID CLINIC ONLY)
CD4 % Helper T Cell: 18 % — ABNORMAL LOW (ref 33–55)
CD4 T Cell Abs: 340 /uL — ABNORMAL LOW (ref 400–2700)

## 2013-02-19 LAB — HIV-1 RNA QUANT-NO REFLEX-BLD
HIV 1 RNA Quant: <20 copies/mL (ref <20)
HIV-1 RNA Quant, Log: <1.3 {Log} (ref <1.30)

## 2013-03-04 ENCOUNTER — Ambulatory Visit (INDEPENDENT_AMBULATORY_CARE_PROVIDER_SITE_OTHER): Payer: BC Managed Care – PPO | Admitting: Internal Medicine

## 2013-03-04 ENCOUNTER — Encounter: Payer: Self-pay | Admitting: Internal Medicine

## 2013-03-04 VITALS — BP 116/76 | HR 73 | Temp 98.3°F | Ht 67.0 in | Wt 132.0 lb

## 2013-03-04 DIAGNOSIS — B20 Human immunodeficiency virus [HIV] disease: Secondary | ICD-10-CM

## 2013-03-04 DIAGNOSIS — Z23 Encounter for immunization: Secondary | ICD-10-CM

## 2013-03-04 DIAGNOSIS — F17200 Nicotine dependence, unspecified, uncomplicated: Secondary | ICD-10-CM

## 2013-03-04 NOTE — Assessment & Plan Note (Signed)
I again discussed tobacco cessation and he was given information on the [place] quit line.

## 2013-03-04 NOTE — Progress Notes (Signed)
  Subjective:    Patient ID: Trevor Leon, male    DOB: 07-Sep-1965, 47 y.o.   MRN: [id number]  HPI  He comes in for followup of his HIV. He has a history of resistance mutations including a 184 and 103. He is on a regimen of Prezista, norvir, truvada and Isentress.  He denies any missed doses and feels well overall. He continues to work in [employer] in does continue to smoke cigarettes, is trying to quit. No recent hospitalizations or changes. No weight loss, no diarrhea.   Review of Systems  Constitutional: Negative for fever, appetite change and fatigue.  HENT: Negative for sore throat and trouble swallowing.   Respiratory: Negative for cough and shortness of breath.   Cardiovascular: Negative for chest pain, palpitations and leg swelling.  Gastrointestinal: Negative for nausea, abdominal pain and diarrhea.  Musculoskeletal: Negative for arthralgias, joint swelling and myalgias.  Skin: Negative for rash.  Neurological: Negative for dizziness, light-headedness and headaches.  Hematological: Negative for adenopathy.       Objective:   Physical Exam  Constitutional: He appears well-developed and well-nourished. No distress.  HENT:  Mouth/Throat: Oropharynx is clear and moist. No oropharyngeal exudate.  Cardiovascular: Normal rate, regular rhythm and normal heart sounds.  Exam reveals no gallop and no friction rub.   No murmur heard. Pulmonary/Chest: Effort normal and breath sounds normal. No respiratory distress. He has no wheezes. He has no rales.  Abdominal: Soft. Bowel sounds are normal. He exhibits no distension. There is no tenderness.          Assessment & Plan:

## 2013-03-04 NOTE — Assessment & Plan Note (Signed)
He is doing well with his current regimen and will return in 6 months.  6 LDL was minimally elevated at 110 and will continue with diet modification.

## 2013-03-11 ENCOUNTER — Encounter: Payer: Self-pay | Admitting: Physician Assistant

## 2013-03-11 ENCOUNTER — Ambulatory Visit (INDEPENDENT_AMBULATORY_CARE_PROVIDER_SITE_OTHER): Payer: BC Managed Care – PPO | Admitting: Physician Assistant

## 2013-03-11 VITALS — BP 120/82 | HR 71 | Temp 97.7°F | Resp 16 | Ht 66.5 in | Wt 133.0 lb

## 2013-03-11 DIAGNOSIS — Z23 Encounter for immunization: Secondary | ICD-10-CM

## 2013-03-11 DIAGNOSIS — F17200 Nicotine dependence, unspecified, uncomplicated: Secondary | ICD-10-CM

## 2013-03-11 DIAGNOSIS — N529 Male erectile dysfunction, unspecified: Secondary | ICD-10-CM

## 2013-03-11 DIAGNOSIS — B20 Human immunodeficiency virus [HIV] disease: Secondary | ICD-10-CM

## 2013-03-11 DIAGNOSIS — Z0000 Encounter for general adult medical examination without abnormal findings: Secondary | ICD-10-CM | POA: Insufficient documentation

## 2013-03-11 DIAGNOSIS — A630 Anogenital (venereal) warts: Secondary | ICD-10-CM

## 2013-03-11 LAB — URINALYSIS, ROUTINE W REFLEX MICROSCOPIC
Bilirubin Urine: NEGATIVE
Ketones, ur: NEGATIVE mg/dL
Leukocytes, UA: NEGATIVE
Protein, ur: NEGATIVE mg/dL
Urobilinogen, UA: 0.2 mg/dL (ref 0.0–1.0)

## 2013-03-11 LAB — HEMOGLOBIN A1C
Hgb A1c MFr Bld: 5.3 % (ref <5.7)
Mean Plasma Glucose: 105 mg/dL (ref <117)

## 2013-03-11 LAB — PSA: PSA: 0.93 ng/mL (ref <=4.00)

## 2013-03-11 NOTE — Progress Notes (Signed)
Patient ID: Trevor Leon, male   DOB: 1965/06/25, 47 y.o.   MRN: [id number]  Patient presents to clinic today to establish care.  Acute Concerns: Patient c/o erectile dysfunction that has been worsening over the past year.  Patient states that over the past month, he has not been able to achieve an erection stiff enough for penetration.  Denies anxiety or depression.  Endorses some stress associated with his job.  Denies trauma to erectile organs.  Denies nocturia, urinary urgency or frequency.  Denies post void dribbling.  Does endorse occasional urinary hesitancy.  Denies family history of prostate cancer.  Denies history of HTN, Diabetes or dyslipidemia.  Recent CMP and Lipid panel look good.  Chronic Issues: (1) Human Immunodeficiency Virus -- Followed by [facility], Dr. Luciana Axe.  Patient endorses taking all medications as prescribed and states he feels like he is in good health overall.  (2) Tobacco use disorder -- Patient is a current smoker.  Does not wish to quit at present time. 5-10 minutes spent on smoking cessation.  Health Maintenance: Dental -- overdue. Vision -- overdue. Immunizations -- up-to-date.  Has had flu shot (1 week ago).     Past Medical History  Diagnosis Date  . HIV infection   . Chicken pox     Current Outpatient Prescriptions on File Prior to Visit  Medication Sig Dispense Refill  . Darunavir Ethanolate (PREZISTA) 800 MG tablet Take 1 tablet (800 mg total) by mouth daily with breakfast.  30 tablet  5  . emtricitabine-tenofovir (TRUVADA) 200-300 MG per tablet Take 1 tablet by mouth daily.  30 tablet  5  . raltegravir (ISENTRESS) 400 MG tablet Take 1 tablet (400 mg total) by mouth 2 (two) times daily.  60 tablet  5  . ritonavir (NORVIR) 100 MG TABS tablet Take 1 tablet (100 mg total) by mouth daily with breakfast.  30 tablet  5   No current facility-administered medications on file prior to visit.    No Known  Allergies  Family History  Problem Relation Age of Onset  . Diabetes Mother   . Diabetes Father   . Hypertension Mother   . Hypertension Father     History   Social History  . Marital Status: Married    Spouse Name: N/A    Number of Children: N/A  . Years of Education: N/A   Social History Main Topics  . Smoking status: Current Every Day Smoker -- 1.00 packs/day for 25 years    Types: Cigarettes  . Smokeless tobacco: Never Used     Comment: pt. not ready to quit   . Alcohol Use: 7.0 oz/week    14 drink(s) per week     Comment: 2 times per week  . Drug Use: Yes    Special: Marijuana  . Sexual Activity: Not Currently     Comment: pt. declined condoms   Other Topics Concern  . None   Social History Narrative  . None   Review of Systems  Constitutional: Negative for fever, chills, weight loss and malaise/fatigue.  HENT: Negative for ear discharge, ear pain, hearing loss and tinnitus.   Eyes: Negative for blurred vision, double vision, photophobia and pain.  Respiratory: Negative for cough, shortness of breath and wheezing.   Cardiovascular: Negative for chest pain and palpitations.  Gastrointestinal: Negative for heartburn, nausea, vomiting, abdominal pain, diarrhea, constipation, blood in stool and melena.  Genitourinary: Negative for dysuria, urgency, frequency, hematuria and flank pain.  Musculoskeletal:  Negative for myalgias.  Neurological: Negative for dizziness, seizures, loss of consciousness and headaches.  Endo/Heme/Allergies: Negative for environmental allergies.  Psychiatric/Behavioral: Negative for depression, suicidal ideas, hallucinations and substance abuse. The patient is not nervous/anxious and does not have insomnia.    Filed Vitals:   03/11/13 0735  BP: 120/82  Pulse: 71  Temp: 97.7 F (36.5 C)  Resp: 16    Physical Exam  Vitals reviewed. Constitutional: He is oriented to person, place, and time and well-developed, well-nourished, and in no  distress.  HENT:  Head: Normocephalic and atraumatic.  Right Ear: External ear normal.  Left Ear: External ear normal.  Nose: Nose normal.  Mouth/Throat: Oropharynx is clear and moist. No oropharyngeal exudate.  Tympanic membranes within normal limits bilaterally.  Eyes: Conjunctivae and EOM are normal. Pupils are equal, round, and reactive to light. No scleral icterus.  Neck: Neck supple. No thyromegaly present.  Cardiovascular: Normal rate, regular rhythm, normal heart sounds and intact distal pulses.   Pulmonary/Chest: Effort normal and breath sounds normal. No respiratory distress. He has no wheezes. He has no rales. He exhibits no tenderness.  Abdominal: Soft. Bowel sounds are normal. He exhibits no distension and no mass. There is no tenderness. There is no rebound and no guarding.  Genitourinary: Rectal exam shows no external hemorrhoid, no internal hemorrhoid, no fissure, no laceration, no mass and no tenderness. Prostate is not enlarged and not tender. He exhibits no abnormal testicular mass, no testicular tenderness, no abnormal scrotal mass, no scrotal tenderness and no epididymal tenderness. Penis exhibits lesions. No discharge found.  Presence of a 5 mm verrucous growth on shaft of penis.  No other lesion noted.  Lymphadenopathy:    He has no cervical adenopathy.  Neurological: He is alert and oriented to person, place, and time. No cranial nerve deficit.  Skin: Skin is warm and dry. No rash noted.  Psychiatric: Affect normal.     Recent Results (from the past 2160 hour(s))  CBC WITH DIFFERENTIAL     Status: Abnormal   Collection Time    02/18/13  9:48 AM      Result Value Range   WBC 8.2  4.0 - 10.5 K/uL   RBC 4.62  4.22 - 5.81 MIL/uL   Hemoglobin 15.9  13.0 - 17.0 g/dL   HCT 16.1  09.6 - 04.5 %   MCV 97.2  78.0 - 100.0 fL   MCH 34.4 (*) 26.0 - 34.0 pg   MCHC 35.4  30.0 - 36.0 g/dL   RDW 40.9  81.1 - 91.4 %   Platelets 214  150 - 400 K/uL   Neutrophils Relative % 66   43 - 77 %   Neutro Abs 5.5  1.7 - 7.7 K/uL   Lymphocytes Relative 24  12 - 46 %   Lymphs Abs 2.0  0.7 - 4.0 K/uL   Monocytes Relative 7  3 - 12 %   Monocytes Absolute 0.6  0.1 - 1.0 K/uL   Eosinophils Relative 2  0 - 5 %   Eosinophils Absolute 0.1  0.0 - 0.7 K/uL   Basophils Relative 1  0 - 1 %   Basophils Absolute 0.0  0.0 - 0.1 K/uL   Smear Review Criteria for review not met    COMPLETE METABOLIC PANEL WITH GFR     Status: None   Collection Time    02/18/13  9:48 AM      Result Value Range   Sodium 138  135 - 145  mEq/L   Potassium 4.1  3.5 - 5.3 mEq/L   Chloride 105  96 - 112 mEq/L   CO2 25  19 - 32 mEq/L   Glucose, Bld 93  70 - 99 mg/dL   BUN 15  6 - 23 mg/dL   Creat 0.45  4.09 - 8.11 mg/dL   Total Bilirubin 0.6  0.3 - 1.2 mg/dL   Alkaline Phosphatase 60  39 - 117 U/L   AST 16  0 - 37 U/L   ALT 13  0 - 53 U/L   Total Protein 6.2  6.0 - 8.3 g/dL   Albumin 4.1  3.5 - 5.2 g/dL   Calcium 9.0  8.4 - 91.4 mg/dL   GFR, Est African American >89     GFR, Est Non African American 82     Comment:       The estimated GFR is a calculation valid for adults (>=73 years old)     that uses the CKD-EPI algorithm to adjust for age and sex. It is       not to be used for children, pregnant women, hospitalized patients,        patients on dialysis, or with rapidly changing kidney function.     According to the NKDEP, eGFR >89 is normal, 60-89 shows mild     impairment, 30-59 shows moderate impairment, 15-29 shows severe     impairment and <15 is ESRD.        HIV 1 RNA QUANT-NO REFLEX-BLD     Status: None   Collection Time    02/18/13  9:48 AM      Result Value Range   HIV 1 RNA Quant <20  <20 copies/mL   HIV1 RNA Quant, Log <1.30  <1.30 log 10   Comment:       This test utilizes the [place] FDA approved Roche HIV-1 Test Kit by RT-PCR.        LIPID PANEL     Status: Abnormal   Collection Time    02/18/13  9:48 AM      Result Value Range   Cholesterol 167  0 - 200 mg/dL   Comment: ATP  III Classification:           < 200        mg/dL        Desirable          200 - 239     mg/dL        Borderline High          >= 240        mg/dL        High         Triglycerides 102  <150 mg/dL   HDL 37 (*) >78 mg/dL   Total CHOL/HDL Ratio 4.5     VLDL 20  0 - 40 mg/dL   LDL Cholesterol 295 (*) 0 - 99 mg/dL   Comment:       Total Cholesterol/HDL Ratio:CHD Risk                            Coronary Heart Disease Risk Table                                            Men  Women              1/2 Average Risk              3.4        3.3                  Average Risk              5.0        4.4               2X Average Risk              9.6        7.1               3X Average Risk             23.4       11.0     Use the calculated Patient Ratio above and the CHD Risk table      to determine the patient's CHD Risk.     ATP III Classification (LDL):           < 100        mg/dL         Optimal          100 - 129     mg/dL         Near or Above Optimal          130 - 159     mg/dL         Borderline High          160 - 189     mg/dL         High           > 190        mg/dL         Very High        RPR     Status: None   Collection Time    02/18/13  9:48 AM      Result Value Range   RPR NON REAC  NON REAC  T-HELPER CELL (CD4)     Status: Abnormal   Collection Time    02/18/13 11:00 AM      Result Value Range   CD4 T Cell Abs 340 (*) 400 - 2700 /uL   CD4 % Helper T Cell 18 (*) 33 - 55 %   Comment: Performed at [facility]    Assessment/Plan: Erectile dysfunction GU exam and DRE within normal limits.  Will obtain A1C, Urinalysis, PSA.  Recent CBC and CMP within normal limits.  Will begin therapy pending normal lab results.  HIV DISEASE Followed by Dr. Luciana Axe.  Last appointment on 03/04/13.  Patient endorses taking medications as prescribed.  Tobacco use disorder Patient declines smoking cessation interventions at this time.  Information given about  cessation tips in patient's AVS.  Visit for preventive health examination TDaP given by nursing staff.  Will obtain fasting labs.  Other health maintenance up-to-date.

## 2013-03-11 NOTE — Patient Instructions (Signed)
Please obtain labs.  I will call you with your results.  We will start a medication if your labs are normal.  You will get a call from a dermatology.  Smoking Cessation Quitting smoking is important to your health and has many advantages. However, it is not always easy to quit since nicotine is a very addictive drug. Often times, people try 3 times or more before being able to quit. This document explains the best ways for you to prepare to quit smoking. Quitting takes hard work and a lot of effort, but you can do it. ADVANTAGES OF QUITTING SMOKING  You will live longer, feel better, and live better.  Your body will feel the impact of quitting smoking almost immediately.  Within 20 minutes, blood pressure decreases. Your pulse returns to its normal level.  After 8 hours, carbon monoxide levels in the blood return to normal. Your oxygen level increases.  After 24 hours, the chance of having a heart attack starts to decrease. Your breath, hair, and body stop smelling like smoke.  After 48 hours, damaged nerve endings begin to recover. Your sense of taste and smell improve.  After 72 hours, the body is virtually free of nicotine. Your bronchial tubes relax and breathing becomes easier.  After 2 to 12 weeks, lungs can hold more air. Exercise becomes easier and circulation improves.  The risk of having a heart attack, stroke, cancer, or lung disease is greatly reduced.  After 1 year, the risk of coronary heart disease is cut in half.  After 5 years, the risk of stroke falls to the same as a nonsmoker.  After 10 years, the risk of lung cancer is cut in half and the risk of other cancers decreases significantly.  After 15 years, the risk of coronary heart disease drops, usually to the level of a nonsmoker.  If you are pregnant, quitting smoking will improve your chances of having a healthy baby.  The people you live with, especially any children, will be healthier.  You will have extra  money to spend on things other than cigarettes. QUESTIONS TO THINK ABOUT BEFORE ATTEMPTING TO QUIT You may want to talk about your answers with your caregiver.  Why do you want to quit?  If you tried to quit in the past, what helped and what did not?  What will be the most difficult situations for you after you quit? How will you plan to handle them?  Who can help you through the tough times? Your family? Friends? A caregiver?  What pleasures do you get from smoking? What ways can you still get pleasure if you quit? Here are some questions to ask your caregiver:  How can you help me to be successful at quitting?  What medicine do you think would be best for me and how should I take it?  What should I do if I need more help?  What is smoking withdrawal like? How can I get information on withdrawal? GET READY  Set a quit date.  Change your environment by getting rid of all cigarettes, ashtrays, matches, and lighters in your home, car, or work. Do not let people smoke in your home.  Review your past attempts to quit. Think about what worked and what did not. GET SUPPORT AND ENCOURAGEMENT You have a better chance of being successful if you have help. You can get support in many ways.  Tell your family, friends, and co-workers that you are going to quit and need their  support. Ask them not to smoke around you.  Get individual, group, or telephone counseling and support. Programs are available at [employer] and health centers. Call your local health department for information about programs in your area.  Spiritual beliefs and practices may help some smokers quit.  Download a "quit meter" on your computer to keep track of quit statistics, such as how long you have gone without smoking, cigarettes not smoked, and money saved.  Get a self-help book about quitting smoking and staying off of tobacco. LEARN NEW SKILLS AND BEHAVIORS  Distract yourself from urges to smoke. Talk to  someone, go for a walk, or occupy your time with a task.  Change your normal routine. Take a different route to work. Drink tea instead of coffee. Eat breakfast in a different place.  Reduce your stress. Take a hot bath, exercise, or read a book.  Plan something enjoyable to do every day. Reward yourself for not smoking.  Explore interactive web-based programs that specialize in helping you quit. GET MEDICINE AND USE IT CORRECTLY Medicines can help you stop smoking and decrease the urge to smoke. Combining medicine with the above behavioral methods and support can greatly increase your chances of successfully quitting smoking.  Nicotine replacement therapy helps deliver nicotine to your body without the negative effects and risks of smoking. Nicotine replacement therapy includes nicotine gum, lozenges, inhalers, nasal sprays, and skin patches. Some may be available over-the-counter and others require a prescription.  Antidepressant medicine helps people abstain from smoking, but how this works is unknown. This medicine is available by prescription.  Nicotinic receptor partial agonist medicine simulates the effect of nicotine in your brain. This medicine is available by prescription. Ask your caregiver for advice about which medicines to use and how to use them based on your health history. Your caregiver will tell you what side effects to look out for if you choose to be on a medicine or therapy. Carefully read the information on the package. Do not use any other product containing nicotine while using a nicotine replacement product.  RELAPSE OR DIFFICULT SITUATIONS Most relapses occur within the first 3 months after quitting. Do not be discouraged if you start smoking again. Remember, most people try several times before finally quitting. You may have symptoms of withdrawal because your body is used to nicotine. You may crave cigarettes, be irritable, feel very hungry, cough often, get headaches,  or have difficulty concentrating. The withdrawal symptoms are only temporary. They are strongest when you first quit, but they will go away within 10 14 days. To reduce the chances of relapse, try to:  Avoid drinking alcohol. Drinking lowers your chances of successfully quitting.  Reduce the amount of caffeine you consume. Once you quit smoking, the amount of caffeine in your body increases and can give you symptoms, such as a rapid heartbeat, sweating, and anxiety.  Avoid smokers because they can make you want to smoke.  Do not let weight gain distract you. Many smokers will gain weight when they quit, usually less than 10 pounds. Eat a healthy diet and stay active. You can always lose the weight gained after you quit.  Find ways to improve your mood other than smoking. FOR MORE INFORMATION  www.smokefree.gov  Document Released: 04/23/2001 Document Revised: 10/29/2011 Document Reviewed: 08/08/2011 [facility] Patient Information 2014 [place], [place].

## 2013-03-11 NOTE — Assessment & Plan Note (Signed)
TDaP given by nursing staff.  Will obtain fasting labs.  Other health maintenance up-to-date.

## 2013-03-11 NOTE — Assessment & Plan Note (Signed)
Followed by Dr. Luciana Axe.  Last appointment on 03/04/13.  Patient endorses taking medications as prescribed.

## 2013-03-11 NOTE — Assessment & Plan Note (Signed)
GU exam and DRE within normal limits.  Will obtain A1C, Urinalysis, PSA.  Recent CBC and CMP within normal limits.  Will begin therapy pending normal lab results.

## 2013-03-11 NOTE — Addendum Note (Signed)
Addended by: Regis Bill on: 03/11/2013 02:26 PM   Modules accepted: Orders

## 2013-03-11 NOTE — Assessment & Plan Note (Signed)
Patient declines smoking cessation interventions at this time.  Information given about cessation tips in patient's AVS.

## 2013-03-12 ENCOUNTER — Telehealth: Payer: Self-pay | Admitting: Physician Assistant

## 2013-03-12 DIAGNOSIS — N529 Male erectile dysfunction, unspecified: Secondary | ICD-10-CM

## 2013-03-12 NOTE — Telephone Encounter (Addendum)
Please inform patient his labs, including PSA and urinalysis were all good.  He is suitable for some sort of therapy for erectile dysfunction.  However, given his current antiviral medications, typical ED medicines like cialis or viagra may not be the best initial treatment given interactions with his antivirals.  I would like to send him to see a urologist for evaluation of the best medication for his symptoms, with the smallest side effect profile.

## 2013-03-12 NOTE — Telephone Encounter (Signed)
LMOM with contact name and number for return call RE: results and further provider instructions/SLS  

## 2013-03-15 NOTE — Telephone Encounter (Signed)
LMOM [2nd] with contact name and number for return call RE: results and further provider instructions/SLS  

## 2013-03-17 NOTE — Telephone Encounter (Signed)
LMOM [3rd] with contact name and number for return call RE: results and further provider instructions; informed will be placing results letter in mail with information/SLS

## 2013-08-30 ENCOUNTER — Other Ambulatory Visit: Payer: BC Managed Care – PPO

## 2013-09-01 ENCOUNTER — Other Ambulatory Visit: Payer: BC Managed Care – PPO

## 2013-09-01 DIAGNOSIS — B20 Human immunodeficiency virus [HIV] disease: Secondary | ICD-10-CM

## 2013-09-02 LAB — T-HELPER CELL (CD4) - (RCID CLINIC ONLY)
CD4 % Helper T Cell: 18 % — ABNORMAL LOW (ref 33–55)
CD4 T Cell Abs: 380 /uL — ABNORMAL LOW (ref 400–2700)

## 2013-09-03 LAB — HIV-1 RNA QUANT-NO REFLEX-BLD
HIV 1 RNA QUANT: 86 {copies}/mL — AB (ref <20)
HIV-1 RNA Quant, Log: 1.93 {Log} — ABNORMAL HIGH (ref <1.30)

## 2013-09-14 ENCOUNTER — Encounter: Payer: Self-pay | Admitting: Internal Medicine

## 2013-09-14 ENCOUNTER — Ambulatory Visit (INDEPENDENT_AMBULATORY_CARE_PROVIDER_SITE_OTHER): Payer: BC Managed Care – PPO | Admitting: Internal Medicine

## 2013-09-14 VITALS — BP 113/73 | HR 80 | Temp 98.3°F | Ht 67.0 in | Wt 135.0 lb

## 2013-09-14 DIAGNOSIS — Z79899 Other long term (current) drug therapy: Secondary | ICD-10-CM

## 2013-09-14 DIAGNOSIS — Z113 Encounter for screening for infections with a predominantly sexual mode of transmission: Secondary | ICD-10-CM | POA: Insufficient documentation

## 2013-09-14 DIAGNOSIS — B20 Human immunodeficiency virus [HIV] disease: Secondary | ICD-10-CM

## 2013-09-14 MED ORDER — DARUNAVIR-COBICISTAT 800-150 MG PO TABS: 1.0000 | ORAL_TABLET | Freq: Every day | ORAL | Status: DC

## 2013-09-14 MED ORDER — EMTRICITABINE-TENOFOVIR DF 200-300 MG PO TABS: 1.0000 | ORAL_TABLET | Freq: Every day | ORAL | Status: DC

## 2013-09-14 MED ORDER — DOLUTEGRAVIR SODIUM 50 MG PO TABS: 50.0000 mg | ORAL_TABLET | Freq: Every day | ORAL | Status: DC

## 2013-09-14 NOTE — Progress Notes (Signed)
  Subjective:    Patient ID: Trevor Leon, male    DOB: Sep 29, 1965, 48 y.o.   MRN: [id number]  HPI  He comes in for followup of his HIV. He has a history of resistance mutations including a 184 and 103. He is on a regimen of Prezista, norvir, truvada and Isentress.  CD4 up to 380, viral load just 86 copies. He denies any missed doses and feels well overall. He continues to work in [employer] in does continue to smoke cigarettes, is trying to quit. No recent hospitalizations or changes. No weight loss, no diarrhea.   Review of Systems  Constitutional: Negative for fever, appetite change and fatigue.  HENT: Negative for sore throat and trouble swallowing.   Respiratory: Negative for cough and shortness of breath.   Cardiovascular: Negative for chest pain, palpitations and leg swelling.  Gastrointestinal: Negative for nausea, abdominal pain and diarrhea.  Musculoskeletal: Negative for arthralgias, joint swelling and myalgias.  Skin: Negative for rash.  Neurological: Negative for dizziness, light-headedness and headaches.  Hematological: Negative for adenopathy.       Objective:   Physical Exam  Constitutional: He appears well-developed and well-nourished. No distress.  HENT:  Mouth/Throat: Oropharynx is clear and moist. No oropharyngeal exudate.  Cardiovascular: Normal rate, regular rhythm and normal heart sounds.  Exam reveals no gallop and no friction rub.   No murmur heard. Pulmonary/Chest: Effort normal and breath sounds normal. No respiratory distress. He has no wheezes. He has no rales.  Abdominal: Soft. Bowel sounds are normal. He exhibits no distension. There is no tenderness.          Assessment & Plan:

## 2013-09-14 NOTE — Assessment & Plan Note (Signed)
Doing well.  I discussed regimen options to facilitate including making it once a day.  I will change to Prezcobix and Tivicay with Truvada.  Copay cards given.  Follow up BMP in 6 weeks on cobicistat to assure to significant eleavation.  RTC 6 months.

## 2013-10-28 ENCOUNTER — Other Ambulatory Visit: Payer: BC Managed Care – PPO

## 2014-02-25 ENCOUNTER — Other Ambulatory Visit: Payer: Self-pay

## 2014-03-14 ENCOUNTER — Other Ambulatory Visit: Payer: BC Managed Care – PPO

## 2014-03-14 DIAGNOSIS — Z113 Encounter for screening for infections with a predominantly sexual mode of transmission: Secondary | ICD-10-CM

## 2014-03-14 DIAGNOSIS — B20 Human immunodeficiency virus [HIV] disease: Secondary | ICD-10-CM

## 2014-03-14 DIAGNOSIS — Z79899 Other long term (current) drug therapy: Secondary | ICD-10-CM

## 2014-03-14 LAB — CBC WITH DIFFERENTIAL/PLATELET
BASOS PCT: 0 % (ref 0–1)
Basophils Absolute: 0 10*3/uL (ref 0.0–0.1)
Eosinophils Absolute: 0.2 10*3/uL (ref 0.0–0.7)
Eosinophils Relative: 2 % (ref 0–5)
HEMATOCRIT: 46.1 % (ref 39.0–52.0)
Hemoglobin: 16.8 g/dL (ref 13.0–17.0)
Lymphocytes Relative: 20 % (ref 12–46)
Lymphs Abs: 2.1 10*3/uL (ref 0.7–4.0)
MCH: 35.7 pg — ABNORMAL HIGH (ref 26.0–34.0)
MCHC: 36.4 g/dL — AB (ref 30.0–36.0)
MCV: 98.1 fL (ref 78.0–100.0)
MONO ABS: 0.7 10*3/uL (ref 0.1–1.0)
Monocytes Relative: 7 % (ref 3–12)
NEUTROS PCT: 71 % (ref 43–77)
Neutro Abs: 7.6 10*3/uL (ref 1.7–7.7)
Platelets: 218 10*3/uL (ref 150–400)
RBC: 4.7 MIL/uL (ref 4.22–5.81)
RDW: 14.2 % (ref 11.5–15.5)
WBC: 10.7 10*3/uL — ABNORMAL HIGH (ref 4.0–10.5)

## 2014-03-14 LAB — LIPID PANEL
CHOLESTEROL: 180 mg/dL (ref 0–200)
HDL: 30 mg/dL — AB (ref >39)
LDL Cholesterol: 95 mg/dL (ref 0–99)
TRIGLYCERIDES: 273 mg/dL — AB (ref <150)
Total CHOL/HDL Ratio: 6 Ratio
VLDL: 55 mg/dL — ABNORMAL HIGH (ref 0–40)

## 2014-03-14 LAB — COMPLETE METABOLIC PANEL WITH GFR
ALK PHOS: 66 U/L (ref 39–117)
ALT: 15 U/L (ref 0–53)
AST: 19 U/L (ref 0–37)
Albumin: 4.3 g/dL (ref 3.5–5.2)
BILIRUBIN TOTAL: 0.5 mg/dL (ref 0.2–1.2)
BUN: 14 mg/dL (ref 6–23)
CO2: 22 mEq/L (ref 19–32)
Calcium: 8.7 mg/dL (ref 8.4–10.5)
Chloride: 103 mEq/L (ref 96–112)
Creat: 1.28 mg/dL (ref 0.50–1.35)
GFR, Est African American: 77 mL/min
GFR, Est Non African American: 67 mL/min
Glucose, Bld: 75 mg/dL (ref 70–99)
Potassium: 3.9 mEq/L (ref 3.5–5.3)
Sodium: 139 mEq/L (ref 135–145)
Total Protein: 6.2 g/dL (ref 6.0–8.3)

## 2014-03-15 LAB — RPR

## 2014-03-15 LAB — HIV-1 RNA QUANT-NO REFLEX-BLD: HIV-1 RNA Quant, Log: <1.3 {Log} (ref <1.30)

## 2014-03-15 LAB — T-HELPER CELL (CD4) - (RCID CLINIC ONLY)
CD4 % Helper T Cell: 17 % — ABNORMAL LOW (ref 33–55)
CD4 T Cell Abs: 370 /uL — ABNORMAL LOW (ref 400–2700)

## 2014-03-19 LAB — HLA B*5701: HLA-B 5701 W/RFLX HLA-B HIGH: NEGATIVE

## 2014-03-22 ENCOUNTER — Ambulatory Visit: Payer: BC Managed Care – PPO | Admitting: Internal Medicine

## 2014-03-29 ENCOUNTER — Ambulatory Visit: Payer: BC Managed Care – PPO | Admitting: Internal Medicine

## 2014-04-14 ENCOUNTER — Encounter: Payer: Self-pay | Admitting: Internal Medicine

## 2014-04-14 ENCOUNTER — Ambulatory Visit (INDEPENDENT_AMBULATORY_CARE_PROVIDER_SITE_OTHER): Payer: BC Managed Care – PPO | Admitting: *Deleted

## 2014-04-14 ENCOUNTER — Ambulatory Visit (INDEPENDENT_AMBULATORY_CARE_PROVIDER_SITE_OTHER): Payer: BC Managed Care – PPO | Admitting: Internal Medicine

## 2014-04-14 VITALS — BP 114/71 | HR 74 | Temp 97.8°F | Ht 66.0 in | Wt 134.0 lb

## 2014-04-14 DIAGNOSIS — B20 Human immunodeficiency virus [HIV] disease: Secondary | ICD-10-CM | POA: Diagnosis not present

## 2014-04-14 DIAGNOSIS — Z23 Encounter for immunization: Secondary | ICD-10-CM | POA: Diagnosis not present

## 2014-04-14 MED ORDER — DARUNAVIR-COBICISTAT 800-150 MG PO TABS
1.0000 | ORAL_TABLET | Freq: Every day | ORAL | Status: DC
Start: 2014-04-14 — End: 2015-05-18

## 2014-04-14 MED ORDER — EMTRICITABINE-TENOFOVIR DF 200-300 MG PO TABS: 1.0000 | ORAL_TABLET | Freq: Every day | ORAL | Status: DC

## 2014-04-14 MED ORDER — DOLUTEGRAVIR SODIUM 50 MG PO TABS: 50.0000 mg | ORAL_TABLET | Freq: Every day | ORAL | Status: DC

## 2014-04-14 NOTE — Assessment & Plan Note (Signed)
Doing great on his salvage regimen.  RTC 6 months.

## 2014-04-14 NOTE — Progress Notes (Signed)
Patient ID: Trevor BienJoseph P Surles, male   DOB: Jun 26, 1965, 48 y.o.   MRN: [id number]  Subjective:    Patient ID: Trevor Leon, male    DOB: Jun 26, 1965, 48 y.o.   MRN: [id number]  HPI He comes in for followup of his HIV. He has a history of resistance mutations including a 184 and 103. He is on a regimen of Prezista, norvir, truvada and Isentress and I changed hi last visit to Tivicay, Truvada and Prezcobix.  CD4 370, viral load undetectable again. He denies any missed doses and feels well overall. He continues to work in [employer] in does continue to smoke cigarettes, is trying to quit. No recent hospitalizations or changes. No weight loss, no diarrhea.   Review of Systems  Constitutional: Negative for fever, appetite change and fatigue.  HENT: Negative for sore throat and trouble swallowing.   Respiratory: Negative for cough and shortness of breath.   Cardiovascular: Negative for chest pain, palpitations and leg swelling.  Gastrointestinal: Negative for nausea, abdominal pain and diarrhea.  Musculoskeletal: Negative for myalgias, joint swelling and arthralgias.  Skin: Negative for rash.  Neurological: Negative for dizziness, light-headedness and headaches.  Hematological: Negative for adenopathy.       Objective:   Physical Exam  Constitutional: He appears well-developed and well-nourished. No distress.  Eyes: Left eye exhibits no discharge.  Cardiovascular: Normal rate, regular rhythm and normal heart sounds.   No murmur heard. Pulmonary/Chest: Effort normal and breath sounds normal. No respiratory distress.  Lymphadenopathy:    He has no cervical adenopathy.  Skin: No rash noted.          Assessment & Plan:

## 2015-05-10 ENCOUNTER — Other Ambulatory Visit: Payer: Self-pay | Admitting: Internal Medicine

## 2015-05-11 ENCOUNTER — Other Ambulatory Visit: Payer: Self-pay | Admitting: Internal Medicine

## 2015-05-18 ENCOUNTER — Other Ambulatory Visit: Payer: Self-pay | Admitting: *Deleted

## 2015-05-18 DIAGNOSIS — B20 Human immunodeficiency virus [HIV] disease: Secondary | ICD-10-CM

## 2015-05-18 MED ORDER — EMTRICITABINE-TENOFOVIR DF 200-300 MG PO TABS
1.0000 | ORAL_TABLET | Freq: Every day | ORAL | Status: DC
Start: 2015-05-18 — End: 2015-07-20

## 2015-05-18 MED ORDER — DARUNAVIR-COBICISTAT 800-150 MG PO TABS
1.0000 | ORAL_TABLET | Freq: Every day | ORAL | Status: DC
Start: 2015-05-18 — End: 2015-09-13

## 2015-05-18 MED ORDER — DOLUTEGRAVIR SODIUM 50 MG PO TABS
50.0000 mg | ORAL_TABLET | Freq: Every day | ORAL | Status: DC
Start: 2015-05-18 — End: 2015-09-13

## 2015-07-06 ENCOUNTER — Other Ambulatory Visit: Payer: BLUE CROSS/BLUE SHIELD

## 2015-07-06 DIAGNOSIS — Z79899 Other long term (current) drug therapy: Secondary | ICD-10-CM

## 2015-07-06 DIAGNOSIS — Z113 Encounter for screening for infections with a predominantly sexual mode of transmission: Secondary | ICD-10-CM

## 2015-07-06 DIAGNOSIS — B20 Human immunodeficiency virus [HIV] disease: Secondary | ICD-10-CM

## 2015-07-06 LAB — COMPREHENSIVE METABOLIC PANEL
ALBUMIN: 4.4 g/dL (ref 3.6–5.1)
ALK PHOS: 64 U/L (ref 40–115)
ALT: 14 U/L (ref 9–46)
AST: 16 U/L (ref 10–40)
BUN: 18 mg/dL (ref 7–25)
CHLORIDE: 104 mmol/L (ref 98–110)
CO2: 23 mmol/L (ref 20–31)
CREATININE: 1.05 mg/dL (ref 0.60–1.35)
Calcium: 9.3 mg/dL (ref 8.6–10.3)
Glucose, Bld: 56 mg/dL — ABNORMAL LOW (ref 65–99)
Potassium: 4.2 mmol/L (ref 3.5–5.3)
SODIUM: 137 mmol/L (ref 135–146)
Total Bilirubin: 0.6 mg/dL (ref 0.2–1.2)
Total Protein: 6.8 g/dL (ref 6.1–8.1)

## 2015-07-06 LAB — CBC WITH DIFFERENTIAL/PLATELET
BASOS ABS: 0 10*3/uL (ref 0.0–0.1)
Basophils Relative: 0 % (ref 0–1)
Eosinophils Absolute: 0.2 10*3/uL (ref 0.0–0.7)
Eosinophils Relative: 2 % (ref 0–5)
HEMATOCRIT: 46.4 % (ref 39.0–52.0)
HEMOGLOBIN: 16 g/dL (ref 13.0–17.0)
LYMPHS PCT: 31 % (ref 12–46)
Lymphs Abs: 2.6 10*3/uL (ref 0.7–4.0)
MCH: 34.6 pg — ABNORMAL HIGH (ref 26.0–34.0)
MCHC: 34.5 g/dL (ref 30.0–36.0)
MCV: 100.2 fL — ABNORMAL HIGH (ref 78.0–100.0)
MONO ABS: 0.7 10*3/uL (ref 0.1–1.0)
MPV: 10.7 fL (ref 8.6–12.4)
Monocytes Relative: 8 % (ref 3–12)
NEUTROS ABS: 4.9 10*3/uL (ref 1.7–7.7)
NEUTROS PCT: 59 % (ref 43–77)
Platelets: 253 10*3/uL (ref 150–400)
RBC: 4.63 MIL/uL (ref 4.22–5.81)
RDW: 14.6 % (ref 11.5–15.5)
WBC: 8.3 10*3/uL (ref 4.0–10.5)

## 2015-07-06 LAB — LIPID PANEL
CHOLESTEROL: 175 mg/dL (ref 125–200)
HDL: 35 mg/dL — ABNORMAL LOW (ref >=40)
LDL Cholesterol: 115 mg/dL (ref <130)
TRIGLYCERIDES: 127 mg/dL (ref <150)
Total CHOL/HDL Ratio: 5 Ratio (ref <=5.0)
VLDL: 25 mg/dL (ref <30)

## 2015-07-07 LAB — T-HELPER CELL (CD4) - (RCID CLINIC ONLY)
CD4 T CELL ABS: 450 /uL (ref 400–2700)
CD4 T CELL HELPER: 18 % — AB (ref 33–55)

## 2015-07-07 LAB — HIV-1 RNA QUANT-NO REFLEX-BLD
HIV 1 RNA Quant: 142 copies/mL — ABNORMAL HIGH (ref <20)
HIV-1 RNA QUANT, LOG: 2.15 {Log_copies}/mL — AB (ref <1.30)

## 2015-07-07 LAB — RPR

## 2015-07-20 ENCOUNTER — Encounter: Payer: Self-pay | Admitting: Internal Medicine

## 2015-07-20 ENCOUNTER — Ambulatory Visit (INDEPENDENT_AMBULATORY_CARE_PROVIDER_SITE_OTHER): Payer: BLUE CROSS/BLUE SHIELD | Admitting: Internal Medicine

## 2015-07-20 VITALS — BP 124/74 | HR 69 | Temp 97.5°F | Ht 66.0 in | Wt 137.0 lb

## 2015-07-20 DIAGNOSIS — F17200 Nicotine dependence, unspecified, uncomplicated: Secondary | ICD-10-CM

## 2015-07-20 DIAGNOSIS — B20 Human immunodeficiency virus [HIV] disease: Secondary | ICD-10-CM

## 2015-07-20 DIAGNOSIS — Z23 Encounter for immunization: Secondary | ICD-10-CM | POA: Diagnosis not present

## 2015-07-20 MED ORDER — EMTRICITABINE-TENOFOVIR AF 200-25 MG PO TABS: 1.0000 | ORAL_TABLET | Freq: Every day | ORAL | Status: DC

## 2015-07-20 NOTE — Assessment & Plan Note (Signed)
Discussed cessation 

## 2015-07-20 NOTE — Assessment & Plan Note (Signed)
Will recheck his viral load with concerns for resistance and off of medications. Discussed the importance of uninterrupted care.

## 2015-07-20 NOTE — Progress Notes (Signed)
CC: Follow up for HIV  Interval history: Currently is asymptomatic and continues on Prezcobix, Truvada, Tivicay.  He has not been here in over 1 year due to family issues, losing both of his wife's parents.  Has no associated n/v, no weight loss.  He did miss over 1 week of medication prior to the labs due to no refill after poor follow up.  Otherwise he denies any missed doses.     Also continues to smoke but considering to start exercising, quitting.   Prior to Admission medications   Medication Sig Start Date End Date Taking? Authorizing Provider  darunavir-cobicistat (PREZCOBIX) 800-150 MG tablet Take 1 tablet by mouth daily. Swallow whole. Do NOT crush, break or chew tablets. Take with food. 05/18/15  Yes Gardiner Barefootobert W Inari Shin, MD  dolutegravir (TIVICAY) 50 MG tablet Take 1 tablet (50 mg total) by mouth daily. 05/18/15  Yes Gardiner Barefootobert W Donne Baley, MD  emtricitabine-tenofovir AF (DESCOVY) 200-25 MG tablet Take 1 tablet by mouth daily. 07/20/15   Gardiner Barefootobert W Deema Juncaj, MD    Review of Systems Constitutional: negative for fatigue, malaise Gastrointestinal: negative for diarrhea Musculoskeletal: negative for myalgias and arthralgias GU: no penile discharge, no warts All other systems reviewed and are negative   Physical Exam: CONSTITUTIONAL:in no apparent distress and alert  Filed Vitals:   07/20/15 0842  BP: 124/74  Pulse: 69  Temp: 97.5 F (36.4 C)   Eyes: anicteric HENT: no thrush, no cervical lymphadenopathy Respiratory: Normal respiratory effort; CTA B  Lab Results  Component Value Date   HIV1RNAQUANT 142* 07/06/2015   HIV1RNAQUANT <20 03/14/2014   HIV1RNAQUANT 86* 09/01/2013   No components found for: HIV1GENOTYPRPLUS No components found for: THELPERCELL

## 2015-07-21 ENCOUNTER — Telehealth: Payer: Self-pay | Admitting: *Deleted

## 2015-07-21 DIAGNOSIS — B20 Human immunodeficiency virus [HIV] disease: Secondary | ICD-10-CM

## 2015-07-21 NOTE — Telephone Encounter (Signed)
PA for Descovy filed via CoverMyMeds

## 2015-07-23 LAB — HIV-1 RNA ULTRAQUANT REFLEX TO GENTYP+
HIV 1 RNA Quant: <20 copies/mL (ref <20)
HIV-1 RNA Quant, Log: <1.3 Log copies/mL (ref <1.30)

## 2015-07-24 NOTE — Telephone Encounter (Signed)
[facility] calling to check on the status of the PA.  He does have Truvada on file if the Descovy is not approved before the patient's next refill. Pharmacist will dispense either Truvada or Descovy depending on authorization result, but not both. Andree CossHowell, Silvanna Ohmer M, RN

## 2015-07-25 MED ORDER — EMTRICITABINE-TENOFOVIR DF 200-300 MG PO TABS: 1.0000 | ORAL_TABLET | Freq: Every day | ORAL | Status: DC

## 2015-07-25 NOTE — Addendum Note (Signed)
Addended by: Andree CossHOWELL, MICHELLE M on: 07/25/2015 02:58 PM   Modules accepted: Orders

## 2015-07-25 NOTE — Addendum Note (Signed)
Addended by: Jennet MaduroESTRIDGE, DENISE D on: 07/25/2015 12:42 PM   Modules accepted: Orders, Medications

## 2015-07-25 NOTE — Telephone Encounter (Signed)
PA for Descovy denied.  RN spoke with Dr. Luciana Axeomer.  Pt to continue on Truvada.  Pharmacy notified.

## 2015-08-03 LAB — HIV-1 INTEGRASE GENOTYPE

## 2015-09-13 ENCOUNTER — Other Ambulatory Visit: Payer: Self-pay | Admitting: *Deleted

## 2015-09-13 DIAGNOSIS — B20 Human immunodeficiency virus [HIV] disease: Secondary | ICD-10-CM

## 2015-09-13 MED ORDER — DOLUTEGRAVIR SODIUM 50 MG PO TABS: 50.0000 mg | ORAL_TABLET | Freq: Every day | ORAL | Status: DC

## 2015-09-13 MED ORDER — DARUNAVIR-COBICISTAT 800-150 MG PO TABS: 1.0000 | ORAL_TABLET | Freq: Every day | ORAL | Status: DC

## 2016-03-01 ENCOUNTER — Other Ambulatory Visit: Payer: Self-pay | Admitting: Internal Medicine

## 2016-03-01 DIAGNOSIS — B20 Human immunodeficiency virus [HIV] disease: Secondary | ICD-10-CM

## 2016-03-26 ENCOUNTER — Other Ambulatory Visit: Payer: BLUE CROSS/BLUE SHIELD

## 2016-03-26 DIAGNOSIS — B20 Human immunodeficiency virus [HIV] disease: Secondary | ICD-10-CM

## 2016-03-27 LAB — T-HELPER CELL (CD4) - (RCID CLINIC ONLY)
CD4 T CELL HELPER: 23 % — AB (ref 33–55)
CD4 T Cell Abs: 520 /uL (ref 400–2700)

## 2016-03-29 LAB — HIV-1 RNA QUANT-NO REFLEX-BLD
HIV 1 RNA Quant: 148 copies/mL — ABNORMAL HIGH (ref <20)
HIV-1 RNA Quant, Log: 2.17 Log copies/mL — ABNORMAL HIGH (ref <1.30)

## 2016-04-10 ENCOUNTER — Encounter: Payer: Self-pay | Admitting: Internal Medicine

## 2016-04-10 ENCOUNTER — Ambulatory Visit (INDEPENDENT_AMBULATORY_CARE_PROVIDER_SITE_OTHER): Payer: BLUE CROSS/BLUE SHIELD | Admitting: Internal Medicine

## 2016-04-10 VITALS — BP 114/69 | HR 69 | Temp 97.8°F | Wt 131.0 lb

## 2016-04-10 DIAGNOSIS — Z5181 Encounter for therapeutic drug level monitoring: Secondary | ICD-10-CM

## 2016-04-10 DIAGNOSIS — B20 Human immunodeficiency virus [HIV] disease: Secondary | ICD-10-CM | POA: Diagnosis not present

## 2016-04-10 DIAGNOSIS — Z113 Encounter for screening for infections with a predominantly sexual mode of transmission: Secondary | ICD-10-CM

## 2016-04-10 DIAGNOSIS — Z79899 Other long term (current) drug therapy: Secondary | ICD-10-CM

## 2016-04-10 DIAGNOSIS — F17200 Nicotine dependence, unspecified, uncomplicated: Secondary | ICD-10-CM | POA: Diagnosis not present

## 2016-04-10 MED ORDER — DARUNAVIR ETHANOLATE 800 MG PO TABS: 800.0000 mg | ORAL_TABLET | Freq: Every day | ORAL | 11 refills | Status: DC

## 2016-04-10 MED ORDER — ELVITEG-COBIC-EMTRICIT-TENOFAF 150-150-200-10 MG PO TABS: 1.0000 | ORAL_TABLET | Freq: Every day | ORAL | 11 refills | Status: DC

## 2016-04-10 NOTE — Assessment & Plan Note (Signed)
Discussed cessation 

## 2016-04-10 NOTE — Progress Notes (Signed)
CC: Follow up for HIV  Interval history: Currently is asymptomatic and continues on Prezcobix, Truvada, Tivicay.    Has no associated n/v, no weight loss.  He does miss an occasional dose and sometimes takes at sporadic times.  Viral load is again 148.      Also continues to smoke and precontemplative. No other new issues.    Prior to Admission medications   Medication Sig Start Date End Date Taking? Authorizing Provider  darunavir-cobicistat (PREZCOBIX) 800-150 MG tablet Take 1 tablet by mouth daily. Swallow whole. Do NOT crush, break or chew tablets. Take with food. 05/18/15  Yes Gardiner Barefootobert W Katoria Yetman, MD  dolutegravir (TIVICAY) 50 MG tablet Take 1 tablet (50 mg total) by mouth daily. 05/18/15  Yes Gardiner Barefootobert W Bryn Saline, MD  emtricitabine-tenofovir AF (DESCOVY) 200-25 MG tablet Take 1 tablet by mouth daily. 07/20/15   Gardiner Barefootobert W Tymesha Ditmore, MD    Review of Systems Constitutional: negative for fatigue, malaise Gastrointestinal: negative for diarrhea Musculoskeletal: negative for myalgias and arthralgias GU: no penile discharge, no warts All other systems reviewed and are negative   Physical Exam: CONSTITUTIONAL:in no apparent distress and alert  Vitals:   04/10/16 1516  BP: 114/69  Pulse: 69  Temp: 97.8 F (36.6 C)   Eyes: anicteric HENT: no thrush, no cervical lymphadenopathy Respiratory: Normal respiratory effort; CTA B  Lab Results  Component Value Date   HIV1RNAQUANT 148 (H) 03/26/2016   HIV1RNAQUANT <20 07/20/2015   HIV1RNAQUANT 142 (H) 07/06/2015   No components found for: HIV1GENOTYPRPLUS No components found for: THELPERCELL

## 2016-04-10 NOTE — Assessment & Plan Note (Signed)
I will check again today and if suppressed, rtc 6 months.  I again emphasized good compliance, particularly with previous resistance. I though also [facility]

## 2016-04-10 NOTE — Assessment & Plan Note (Signed)
No side effects and tolerating well.

## 2016-04-12 LAB — HIV-1 RNA QUANT-NO REFLEX-BLD
HIV 1 RNA Quant: 103 copies/mL — ABNORMAL HIGH (ref <20)
HIV-1 RNA Quant, Log: 2.01 Log copies/mL — ABNORMAL HIGH (ref <1.30)

## 2016-05-16 ENCOUNTER — Other Ambulatory Visit: Payer: Self-pay | Admitting: Internal Medicine

## 2016-05-16 DIAGNOSIS — B20 Human immunodeficiency virus [HIV] disease: Secondary | ICD-10-CM

## 2016-05-17 ENCOUNTER — Other Ambulatory Visit: Payer: Self-pay | Admitting: Internal Medicine

## 2016-05-17 DIAGNOSIS — B20 Human immunodeficiency virus [HIV] disease: Secondary | ICD-10-CM

## 2016-10-08 ENCOUNTER — Other Ambulatory Visit (HOSPITAL_COMMUNITY)
Admission: RE | Admit: 2016-10-08 | Discharge: 2016-10-08 | Disposition: A | Discharge status: Home / Self Care | Payer: BLUE CROSS/BLUE SHIELD | Source: Ambulatory Visit | Attending: Internal Medicine | Admitting: Internal Medicine

## 2016-10-08 ENCOUNTER — Other Ambulatory Visit: Payer: BLUE CROSS/BLUE SHIELD

## 2016-10-08 DIAGNOSIS — Z113 Encounter for screening for infections with a predominantly sexual mode of transmission: Secondary | ICD-10-CM

## 2016-10-08 DIAGNOSIS — Z79899 Other long term (current) drug therapy: Secondary | ICD-10-CM

## 2016-10-08 DIAGNOSIS — B20 Human immunodeficiency virus [HIV] disease: Secondary | ICD-10-CM

## 2016-10-08 LAB — CBC WITH DIFFERENTIAL/PLATELET
BASOS ABS: 0 {cells}/uL (ref 0–200)
Basophils Relative: 0 %
EOS ABS: 218 {cells}/uL (ref 15–500)
EOS PCT: 2 %
HCT: 46.8 % (ref 38.5–50.0)
Hemoglobin: 15.8 g/dL (ref 13.2–17.1)
LYMPHS PCT: 19 %
Lymphs Abs: 2071 cells/uL (ref 850–3900)
MCH: 35 pg — AB (ref 27.0–33.0)
MCHC: 33.8 g/dL (ref 32.0–36.0)
MCV: 103.5 fL — AB (ref 80.0–100.0)
MONOS PCT: 8 %
MPV: 10.5 fL (ref 7.5–12.5)
Monocytes Absolute: 872 cells/uL (ref 200–950)
NEUTROS PCT: 71 %
Neutro Abs: 7739 cells/uL (ref 1500–7800)
PLATELETS: 274 10*3/uL (ref 140–400)
RBC: 4.52 MIL/uL (ref 4.20–5.80)
RDW: 14.4 % (ref 11.0–15.0)
WBC: 10.9 10*3/uL — ABNORMAL HIGH (ref 3.8–10.8)

## 2016-10-08 LAB — COMPLETE METABOLIC PANEL WITH GFR
ALT: 14 U/L (ref 9–46)
AST: 16 U/L (ref 10–40)
Albumin: 4.2 g/dL (ref 3.6–5.1)
Alkaline Phosphatase: 73 U/L (ref 40–115)
BILIRUBIN TOTAL: 0.4 mg/dL (ref 0.2–1.2)
BUN: 15 mg/dL (ref 7–25)
CO2: 26 mmol/L (ref 20–31)
Calcium: 9.2 mg/dL (ref 8.6–10.3)
Chloride: 105 mmol/L (ref 98–110)
Creat: 1.07 mg/dL (ref 0.60–1.35)
GFR, EST NON AFRICAN AMERICAN: 81 mL/min (ref >=60)
GFR, Est African American: >89 mL/min (ref >=60)
GLUCOSE: 129 mg/dL — AB (ref 65–99)
Potassium: 4.5 mmol/L (ref 3.5–5.3)
SODIUM: 138 mmol/L (ref 135–146)
TOTAL PROTEIN: 6.5 g/dL (ref 6.1–8.1)

## 2016-10-08 LAB — LIPID PANEL
Cholesterol: 170 mg/dL (ref <200)
HDL: 40 mg/dL — ABNORMAL LOW (ref >40)
LDL CALC: 110 mg/dL — AB (ref <100)
TRIGLYCERIDES: 102 mg/dL (ref <150)
Total CHOL/HDL Ratio: 4.3 Ratio (ref <5.0)
VLDL: 20 mg/dL (ref <30)

## 2016-10-09 LAB — URINE CYTOLOGY ANCILLARY ONLY
Chlamydia: NEGATIVE
NEISSERIA GONORRHEA: NEGATIVE

## 2016-10-09 LAB — RPR

## 2016-10-09 LAB — T-HELPER CELL (CD4) - (RCID CLINIC ONLY)
CD4 T CELL HELPER: 15 % — AB (ref 33–55)
CD4 T Cell Abs: 350 /uL — ABNORMAL LOW (ref 400–2700)

## 2016-10-10 LAB — HIV-1 RNA QUANT-NO REFLEX-BLD
HIV 1 RNA Quant: 43 copies/mL — ABNORMAL HIGH
HIV-1 RNA Quant, Log: 1.63 Log copies/mL — ABNORMAL HIGH

## 2016-10-28 ENCOUNTER — Ambulatory Visit (INDEPENDENT_AMBULATORY_CARE_PROVIDER_SITE_OTHER): Payer: BLUE CROSS/BLUE SHIELD | Admitting: Internal Medicine

## 2016-10-28 ENCOUNTER — Encounter: Payer: Self-pay | Admitting: Internal Medicine

## 2016-10-28 VITALS — BP 113/74 | HR 81 | Temp 98.1°F | Ht 66.0 in | Wt 133.0 lb

## 2016-10-28 DIAGNOSIS — Z79899 Other long term (current) drug therapy: Secondary | ICD-10-CM

## 2016-10-28 DIAGNOSIS — Z5181 Encounter for therapeutic drug level monitoring: Secondary | ICD-10-CM | POA: Diagnosis not present

## 2016-10-28 DIAGNOSIS — B20 Human immunodeficiency virus [HIV] disease: Secondary | ICD-10-CM

## 2016-10-28 DIAGNOSIS — F17200 Nicotine dependence, unspecified, uncomplicated: Secondary | ICD-10-CM

## 2016-10-28 NOTE — Assessment & Plan Note (Signed)
Creat stable.   

## 2016-10-28 NOTE — Progress Notes (Signed)
   Subjective:    Patient ID: Fanny BienJoseph P Buccieri, male    DOB: [birth year], 51 y.o.   MRN: [id number]  HPI Here for follow up of HIV. Has been on a salvage regimen with mulitple mutations including a 184v and I changed him last visit to [place] and Prezista.  He has had issues with some missed doses but has been taking daily since last visit.  CD4 350 and viral load 43.  No associated n/v/d.  No rashes.     Review of Systems  Constitutional: Negative for fatigue.  Gastrointestinal: Negative for nausea.  Skin: Negative for rash.       Objective:   Physical Exam  Constitutional: He appears well-developed and well-nourished. No distress.  HENT:  Mouth/Throat: No oropharyngeal exudate.  Eyes: No scleral icterus.  Cardiovascular: Normal rate, regular rhythm and normal heart sounds.   No murmur heard. Pulmonary/Chest: Effort normal and breath sounds normal. No respiratory distress.  Lymphadenopathy:    He has no cervical adenopathy.  Skin: No rash noted.    SH: continues to smoke      Assessment & Plan:

## 2016-10-28 NOTE — Assessment & Plan Note (Signed)
Doing well on his regimen and suppressed.  rtc 6 months

## 2016-10-28 NOTE — Assessment & Plan Note (Signed)
More contemplative now and considering trying to vape with less nicotine.

## 2016-10-28 NOTE — Assessment & Plan Note (Signed)
Lipid panel done and ldl 110, no indication for treatment.

## 2017-04-15 ENCOUNTER — Other Ambulatory Visit: Payer: BLUE CROSS/BLUE SHIELD

## 2017-04-24 ENCOUNTER — Other Ambulatory Visit: Payer: Self-pay | Admitting: Internal Medicine

## 2017-04-29 ENCOUNTER — Ambulatory Visit: Payer: BLUE CROSS/BLUE SHIELD | Admitting: Internal Medicine

## 2017-05-09 ENCOUNTER — Other Ambulatory Visit: Payer: BLUE CROSS/BLUE SHIELD

## 2017-05-09 DIAGNOSIS — B20 Human immunodeficiency virus [HIV] disease: Secondary | ICD-10-CM

## 2017-05-09 LAB — T-HELPER CELL (CD4) - (RCID CLINIC ONLY)
CD4 T CELL ABS: 540 /uL (ref 400–2700)
CD4 T CELL HELPER: 20 % — AB (ref 33–55)

## 2017-05-12 LAB — HIV-1 RNA QUANT-NO REFLEX-BLD
HIV 1 RNA Quant: 39 copies/mL — ABNORMAL HIGH
HIV-1 RNA Quant, Log: 1.59 Log copies/mL — ABNORMAL HIGH

## 2017-05-22 ENCOUNTER — Ambulatory Visit (INDEPENDENT_AMBULATORY_CARE_PROVIDER_SITE_OTHER): Payer: BLUE CROSS/BLUE SHIELD | Admitting: Internal Medicine

## 2017-05-22 ENCOUNTER — Encounter: Payer: Self-pay | Admitting: Internal Medicine

## 2017-05-22 ENCOUNTER — Other Ambulatory Visit: Payer: Self-pay

## 2017-05-22 VITALS — BP 132/77 | HR 92 | Temp 98.2°F | Ht 66.5 in | Wt 130.0 lb

## 2017-05-22 DIAGNOSIS — B20 Human immunodeficiency virus [HIV] disease: Secondary | ICD-10-CM | POA: Diagnosis not present

## 2017-05-22 DIAGNOSIS — Z113 Encounter for screening for infections with a predominantly sexual mode of transmission: Secondary | ICD-10-CM

## 2017-05-22 DIAGNOSIS — Z7189 Other specified counseling: Secondary | ICD-10-CM

## 2017-05-22 DIAGNOSIS — Z79899 Other long term (current) drug therapy: Secondary | ICD-10-CM

## 2017-05-22 DIAGNOSIS — Z7185 Encounter for immunization safety counseling: Secondary | ICD-10-CM

## 2017-05-22 DIAGNOSIS — F17200 Nicotine dependence, unspecified, uncomplicated: Secondary | ICD-10-CM | POA: Diagnosis not present

## 2017-05-22 NOTE — Assessment & Plan Note (Signed)
Counseled on quitting; is precontemplative

## 2017-05-22 NOTE — Assessment & Plan Note (Signed)
Doing well and no missed doses.  rtc 6 months

## 2017-05-22 NOTE — Assessment & Plan Note (Signed)
Counseled on the flu shot and given today 

## 2017-05-22 NOTE — Progress Notes (Signed)
   Subjective:    Patient ID: Trevor Leon, male    DOB: 1965/08/23, 52 y.o.   MRN: [id number]  HPI Here for follow up of HIV. Has been on a salvage regimen with mulitple mutations including a 184v and now is on Genvoya and Prezista.  He has had issues with some missed doses but has been taking daily since last visit.  CD4 540 and viral load 39.  No associated n/v/d.  No rashes.  No rash.     Review of Systems  Constitutional: Negative for fatigue.  Gastrointestinal: Negative for nausea.  Skin: Negative for rash.       Objective:   Physical Exam  Constitutional: He appears well-developed and well-nourished. No distress.  HENT:  Mouth/Throat: No oropharyngeal exudate.  Eyes: No scleral icterus.  Cardiovascular: Normal rate, regular rhythm and normal heart sounds.  No murmur heard. Pulmonary/Chest: Effort normal and breath sounds normal. No respiratory distress.  Lymphadenopathy:    He has no cervical adenopathy.  Skin: No rash noted.    SH: continues to smoke      Assessment & Plan:

## 2017-11-06 ENCOUNTER — Other Ambulatory Visit: Payer: BLUE CROSS/BLUE SHIELD

## 2017-11-20 ENCOUNTER — Ambulatory Visit: Payer: BLUE CROSS/BLUE SHIELD | Admitting: Internal Medicine

## 2017-12-22 ENCOUNTER — Other Ambulatory Visit: Payer: BLUE CROSS/BLUE SHIELD

## 2017-12-22 DIAGNOSIS — B20 Human immunodeficiency virus [HIV] disease: Secondary | ICD-10-CM

## 2017-12-22 DIAGNOSIS — Z79899 Other long term (current) drug therapy: Secondary | ICD-10-CM

## 2017-12-22 DIAGNOSIS — Z113 Encounter for screening for infections with a predominantly sexual mode of transmission: Secondary | ICD-10-CM

## 2017-12-24 LAB — COMPLETE METABOLIC PANEL WITH GFR
AG RATIO: 1.8 (calc) (ref 1.0–2.5)
ALKALINE PHOSPHATASE (APISO): 70 U/L (ref 40–115)
ALT: 13 U/L (ref 9–46)
AST: 18 U/L (ref 10–35)
Albumin: 4.6 g/dL (ref 3.6–5.1)
BILIRUBIN TOTAL: 0.9 mg/dL (ref 0.2–1.2)
BUN: 17 mg/dL (ref 7–25)
CHLORIDE: 106 mmol/L (ref 98–110)
CO2: 25 mmol/L (ref 20–32)
Calcium: 9.4 mg/dL (ref 8.6–10.3)
Creat: 0.99 mg/dL (ref 0.70–1.33)
GFR, Est African American: 102 mL/min/{1.73_m2} (ref >=60)
GFR, Est Non African American: 88 mL/min/{1.73_m2} (ref >=60)
Globulin: 2.6 g/dL (calc) (ref 1.9–3.7)
Glucose, Bld: 97 mg/dL (ref 65–99)
POTASSIUM: 4.2 mmol/L (ref 3.5–5.3)
Sodium: 138 mmol/L (ref 135–146)
Total Protein: 7.2 g/dL (ref 6.1–8.1)

## 2017-12-24 LAB — CBC WITH DIFFERENTIAL/PLATELET
Basophils Absolute: 60 cells/uL (ref 0–200)
Basophils Relative: 0.7 %
EOS ABS: 206 {cells}/uL (ref 15–500)
Eosinophils Relative: 2.4 %
HCT: 44.9 % (ref 38.5–50.0)
Hemoglobin: 16.2 g/dL (ref 13.2–17.1)
Lymphs Abs: 2098 cells/uL (ref 850–3900)
MCH: 36.2 pg — AB (ref 27.0–33.0)
MCHC: 36.1 g/dL — ABNORMAL HIGH (ref 32.0–36.0)
MCV: 100.4 fL — ABNORMAL HIGH (ref 80.0–100.0)
MPV: 10 fL (ref 7.5–12.5)
Monocytes Relative: 10.9 %
NEUTROS PCT: 61.6 %
Neutro Abs: 5298 cells/uL (ref 1500–7800)
PLATELETS: 297 10*3/uL (ref 140–400)
RBC: 4.47 10*6/uL (ref 4.20–5.80)
RDW: 13.7 % (ref 11.0–15.0)
TOTAL LYMPHOCYTE: 24.4 %
WBC mixed population: 937 cells/uL (ref 200–950)
WBC: 8.6 10*3/uL (ref 3.8–10.8)

## 2017-12-24 LAB — LIPID PANEL
CHOL/HDL RATIO: 2.9 (calc) (ref <5.0)
CHOLESTEROL: 160 mg/dL (ref <200)
HDL: 55 mg/dL (ref >40)
LDL CHOLESTEROL (CALC): 88 mg/dL
Non-HDL Cholesterol (Calc): 105 mg/dL (calc) (ref <130)
Triglycerides: 83 mg/dL (ref <150)

## 2017-12-24 LAB — HIV-1 RNA QUANT-NO REFLEX-BLD
HIV 1 RNA Quant: <20 copies/mL — AB
HIV-1 RNA Quant, Log: <1.3 Log copies/mL — AB

## 2017-12-24 LAB — RPR: RPR: NONREACTIVE

## 2018-01-05 ENCOUNTER — Encounter: Payer: BLUE CROSS/BLUE SHIELD | Admitting: Internal Medicine

## 2018-08-24 ENCOUNTER — Other Ambulatory Visit: Payer: Self-pay

## 2018-08-24 ENCOUNTER — Emergency Department (HOSPITAL_BASED_OUTPATIENT_CLINIC_OR_DEPARTMENT_OTHER)
Admission: EM | Admit: 2018-08-24 | Discharge: 2018-08-24 | Disposition: A | Discharge status: Home / Self Care | Payer: BLUE CROSS/BLUE SHIELD | Attending: Emergency Medicine | Admitting: Emergency Medicine

## 2018-08-24 ENCOUNTER — Encounter (HOSPITAL_BASED_OUTPATIENT_CLINIC_OR_DEPARTMENT_OTHER): Payer: Self-pay | Admitting: *Deleted

## 2018-08-24 DIAGNOSIS — Z79899 Other long term (current) drug therapy: Secondary | ICD-10-CM | POA: Diagnosis not present

## 2018-08-24 DIAGNOSIS — Z21 Asymptomatic human immunodeficiency virus [HIV] infection status: Secondary | ICD-10-CM | POA: Insufficient documentation

## 2018-08-24 DIAGNOSIS — B029 Zoster without complications: Secondary | ICD-10-CM | POA: Insufficient documentation

## 2018-08-24 DIAGNOSIS — F17210 Nicotine dependence, cigarettes, uncomplicated: Secondary | ICD-10-CM | POA: Insufficient documentation

## 2018-08-24 DIAGNOSIS — R21 Rash and other nonspecific skin eruption: Secondary | ICD-10-CM | POA: Diagnosis present

## 2018-08-24 MED ORDER — ACYCLOVIR 400 MG PO TABS: 800.0000 mg | ORAL_TABLET | Freq: Every day | ORAL | 0 refills | Status: AC

## 2018-08-24 MED FILL — ACYCLOVIR 400 MG TABLET: 400 | 7 days supply | Qty: 70 | Fill #0

## 2018-08-24 NOTE — ED Triage Notes (Signed)
Rash  On rt lower back x 5 days   Increased itching and pain

## 2018-08-24 NOTE — ED Provider Notes (Signed)
[facility] EMERGENCY DEPARTMENT Provider Note   CSN: [id number] Arrival date & time: 08/24/18  0801    History   Chief Complaint Chief Complaint  Patient presents with  . Rash    HPI Trevor Leon is a 53 y.o. male.     Patient states he had onset of rash about 5 days ago.  Now has a lot of vesicles down around his waistband area on the right side.  Patient is followed by infectious disease is a history of HIV is on antivirals.  Patient denies any fevers cough congestion.  States that the rash just kind of burns and is painful and itches some.  He has been treating it with calamine lotion and Benadryl.      Past Medical History:  Diagnosis Date  . Chicken pox   . HIV infection [facility])     Patient Active Problem List   Diagnosis Date Noted  . Vaccine counseling 05/22/2017  . Medication monitoring encounter 04/10/2016  . Screening examination for venereal disease 09/14/2013  . Encounter for long-term (current) use of high-risk medication 09/14/2013  . Visit for preventive health examination 03/11/2013  . Erectile dysfunction 03/11/2013  . Tobacco use disorder 09/01/2012  . Human immunodeficiency virus (HIV) disease (HCC) 07/15/2006    Past Surgical History:  Procedure Laterality Date  . WISDOM TOOTH EXTRACTION          Home Medications    Prior to Admission medications   Medication Sig Start Date End Date Taking? Authorizing Provider  acyclovir (ZOVIRAX) 400 MG tablet Take 2 tablets (800 mg total) by mouth 5 (five) times daily for 7 days. 08/24/18 08/31/18  Vanetta Mulders, MD  GENVOYA 150-150-200-10 MG TABS tablet TAKE 1 TABLET BY MOUTH DAILY 04/24/17   Gardiner Barefoot, MD  PREZISTA 800 MG tablet TAKE 1 TABLET(800 MG) BY MOUTH DAILY 04/24/17   Comer, Belia Heman, MD    Family History Family History  Problem Relation Age of Onset  . Diabetes Mother   . Hypertension Mother   . Diabetes Father   . Hypertension Father     Social History Social  History   Tobacco Use  . Smoking status: Current Every Day Smoker    Packs/day: 1.00    Years: 25.00    Pack years: 25.00    Types: Cigarettes  . Smokeless tobacco: Never Used  . Tobacco comment: pt. not ready to quit   Substance Use Topics  . Alcohol use: Yes    Alcohol/week: 14.0 standard drinks    Types: 14 Standard drinks or equivalent per week    Comment: 5 times per week  . Drug use: Yes    Types: Marijuana     Allergies   Patient has no known allergies.   Review of Systems Review of Systems  Constitutional: Negative for chills and fever.  HENT: Negative for rhinorrhea and sore throat.   Eyes: Negative for visual disturbance.  Respiratory: Negative for cough and shortness of breath.   Cardiovascular: Negative for chest pain and leg swelling.  Gastrointestinal: Negative for abdominal pain, diarrhea, nausea and vomiting.  Genitourinary: Negative for dysuria.  Musculoskeletal: Negative for back pain and neck pain.  Skin: Positive for rash.  Neurological: Negative for dizziness, light-headedness and headaches.  Hematological: Does not bruise/bleed easily.  Psychiatric/Behavioral: Negative for confusion.     Physical Exam Updated Vital Signs BP (!) 131/93 (BP Location: Right Arm)   Temp 97.9 F (36.6 C) (Oral)   Resp 18  Ht 1.676 m (5\' 6" )   Wt 56.7 kg   SpO2 97%   BMI 20.18 kg/m   Physical Exam Vitals signs and nursing note reviewed.  Constitutional:      General: He is not in acute distress.    Appearance: Normal appearance. He is well-developed.  HENT:     Head: Normocephalic and atraumatic.     Nose: No congestion.  Eyes:     Extraocular Movements: Extraocular movements intact.     Conjunctiva/sclera: Conjunctivae normal.     Pupils: Pupils are equal, round, and reactive to light.  Neck:     Musculoskeletal: Normal range of motion and neck supple.  Cardiovascular:     Rate and Rhythm: Normal rate and regular rhythm.     Heart sounds: No  murmur.  Pulmonary:     Effort: Pulmonary effort is normal. No respiratory distress.     Breath sounds: Normal breath sounds.  Abdominal:     Palpations: Abdomen is soft.     Tenderness: There is no abdominal tenderness.  Musculoskeletal: Normal range of motion.  Skin:    General: Skin is warm and dry.     Findings: Rash present.     Comments: Left-sided rash consistent with herpes zoster.  With vesicles sorted L4-L5 dermatome distribution.  No evidence of any secondary infection.  Neurological:     General: No focal deficit present.     Mental Status: He is alert and oriented to person, place, and time.      ED Treatments / Results  Labs (all labs ordered are listed, but only abnormal results are displayed) Labs Reviewed - No data to display  EKG None  Radiology No results found.  Procedures Procedures (including critical care time)  Medications Ordered in ED Medications - No data to display   Initial Impression / Assessment and Plan / ED Course  I have reviewed the triage vital signs and the nursing notes.  Pertinent labs & imaging results that were available during my care of the patient were reviewed by me and considered in my medical decision making (see chart for details).        Rash consistent with shingles outbreak probably L4-L5 dermatome.  On the right side.  Patient does have HIV is on antivirals.  Appears to be having no complicating factors.  Patient is followed by Dr. Luciana Axeomer from infectious disease will have him call to let them know that he has this infection.  Patient will be started on acyclovir.    Final Clinical Impressions(s) / ED Diagnoses   Final diagnoses:  Herpes zoster without complication    ED Discharge Orders         Ordered    acyclovir (ZOVIRAX) 400 MG tablet  5 times daily     08/24/18 [id number]           Vanetta MuldersZackowski, Neal Oshea, MD 08/24/18 [phone]

## 2018-08-24 NOTE — Discharge Instructions (Addendum)
Take the acyclovir as directed for the next 7 days.  Give infectious disease will call to let them know that you have this infection.  Return for any new or worse symptoms.

## 2019-08-02 ENCOUNTER — Other Ambulatory Visit: Payer: BLUE CROSS/BLUE SHIELD

## 2019-08-03 ENCOUNTER — Ambulatory Visit: Payer: Self-pay

## 2019-08-03 ENCOUNTER — Other Ambulatory Visit: Payer: Self-pay

## 2019-08-03 DIAGNOSIS — Z79899 Other long term (current) drug therapy: Secondary | ICD-10-CM

## 2019-08-03 DIAGNOSIS — B20 Human immunodeficiency virus [HIV] disease: Secondary | ICD-10-CM

## 2019-08-03 DIAGNOSIS — Z113 Encounter for screening for infections with a predominantly sexual mode of transmission: Secondary | ICD-10-CM

## 2019-08-03 NOTE — Addendum Note (Signed)
Addended by: Mariea Clonts D on: 08/03/2019 03:42 PM   Modules accepted: Orders

## 2019-08-03 NOTE — Addendum Note (Signed)
Addended by: Mariea Clonts D on: 08/03/2019 04:15 PM   Modules accepted: Orders

## 2019-08-03 NOTE — Progress Notes (Signed)
lip

## 2019-08-04 ENCOUNTER — Encounter: Payer: Self-pay | Admitting: Internal Medicine

## 2019-08-04 ENCOUNTER — Telehealth: Payer: Self-pay | Admitting: *Deleted

## 2019-08-04 LAB — T-HELPER CELL (CD4) - (RCID CLINIC ONLY)
CD4 % Helper T Cell: 5 % — ABNORMAL LOW (ref 33–65)
CD4 T Cell Abs: 59 /uL — ABNORMAL LOW (ref 400–1790)

## 2019-08-04 NOTE — Telephone Encounter (Signed)
-----   Message from Gardiner Barefoot, MD sent at 08/04/2019  1:56 PM EDT ----- He has a new positive syphilis test, though a low level and has been out of care. Can you check if he has been treated for syphilis in the last 12-18 months or so? If not, he needs bicillin 2.4 million units x 1  thanks

## 2019-08-04 NOTE — Telephone Encounter (Signed)
FALSE POSITIVE per Dr Luciana Axe. No treatment needed. Andree Coss, RN

## 2019-08-13 LAB — COMPREHENSIVE METABOLIC PANEL
AG Ratio: 0.7 (calc) — ABNORMAL LOW (ref 1.0–2.5)
ALT: 127 U/L — ABNORMAL HIGH (ref 9–46)
AST: 254 U/L — ABNORMAL HIGH (ref 10–35)
Albumin: 2.8 g/dL — ABNORMAL LOW (ref 3.6–5.1)
Alkaline phosphatase (APISO): 310 U/L — ABNORMAL HIGH (ref 35–144)
BUN/Creatinine Ratio: 13 (calc) (ref 6–22)
BUN: 8 mg/dL (ref 7–25)
CO2: 26 mmol/L (ref 20–32)
Calcium: 8.2 mg/dL — ABNORMAL LOW (ref 8.6–10.3)
Chloride: 90 mmol/L — ABNORMAL LOW (ref 98–110)
Creat: 0.63 mg/dL — ABNORMAL LOW (ref 0.70–1.33)
Globulin: 4.3 g/dL (calc) — ABNORMAL HIGH (ref 1.9–3.7)
Glucose, Bld: 122 mg/dL — ABNORMAL HIGH (ref 65–99)
Potassium: 2.9 mmol/L — ABNORMAL LOW (ref 3.5–5.3)
Sodium: 134 mmol/L — ABNORMAL LOW (ref 135–146)
Total Bilirubin: 1.8 mg/dL — ABNORMAL HIGH (ref 0.2–1.2)
Total Protein: 7.1 g/dL (ref 6.1–8.1)

## 2019-08-13 LAB — CBC WITH DIFFERENTIAL/PLATELET
Absolute Monocytes: 672 cells/uL (ref 200–950)
Basophils Absolute: 16 cells/uL (ref 0–200)
Basophils Relative: 0.2 %
Eosinophils Absolute: 0 cells/uL — ABNORMAL LOW (ref 15–500)
Eosinophils Relative: 0 %
HCT: 39.5 % (ref 38.5–50.0)
Hemoglobin: 14.1 g/dL (ref 13.2–17.1)
Lymphs Abs: 915 cells/uL (ref 850–3900)
MCH: 34.5 pg — ABNORMAL HIGH (ref 27.0–33.0)
MCHC: 35.7 g/dL (ref 32.0–36.0)
MCV: 96.6 fL (ref 80.0–100.0)
MPV: 12.1 fL (ref 7.5–12.5)
Monocytes Relative: 8.3 %
Neutro Abs: 6496 cells/uL (ref 1500–7800)
Neutrophils Relative %: 80.2 %
Platelets: 119 10*3/uL — ABNORMAL LOW (ref 140–400)
RBC: 4.09 10*6/uL — ABNORMAL LOW (ref 4.20–5.80)
RDW: 12.6 % (ref 11.0–15.0)
Total Lymphocyte: 11.3 %
WBC: 8.1 10*3/uL (ref 3.8–10.8)

## 2019-08-13 LAB — HIV-1 RNA ULTRAQUANT REFLEX TO GENTYP+
HIV 1 RNA Quant: 76600 copies/mL — ABNORMAL HIGH
HIV-1 RNA Quant, Log: 4.88 Log copies/mL — ABNORMAL HIGH

## 2019-08-13 LAB — LIPID PANEL
Cholesterol: 179 mg/dL (ref <200)
HDL: 23 mg/dL — ABNORMAL LOW (ref >=40)
LDL Cholesterol (Calc): 131 mg/dL (calc) — ABNORMAL HIGH
Non-HDL Cholesterol (Calc): 156 mg/dL (calc) — ABNORMAL HIGH (ref <130)
Total CHOL/HDL Ratio: 7.8 (calc) — ABNORMAL HIGH (ref <5.0)
Triglycerides: 142 mg/dL (ref <150)

## 2019-08-13 LAB — FLUORESCENT TREPONEMAL AB(FTA)-IGG-BLD: Fluorescent Treponemal ABS: NONREACTIVE

## 2019-08-13 LAB — RPR TITER: RPR Titer: 1:2 {titer} — ABNORMAL HIGH

## 2019-08-13 LAB — HIV-1 GENOTYPE: HIV-1 Genotype: DETECTED — AB

## 2019-08-13 LAB — RPR: RPR Ser Ql: REACTIVE — AB

## 2019-08-18 ENCOUNTER — Encounter: Payer: Self-pay | Admitting: Internal Medicine

## 2019-08-18 ENCOUNTER — Other Ambulatory Visit: Payer: Self-pay

## 2019-08-18 ENCOUNTER — Ambulatory Visit (INDEPENDENT_AMBULATORY_CARE_PROVIDER_SITE_OTHER): Payer: Self-pay | Admitting: Internal Medicine

## 2019-08-18 DIAGNOSIS — B20 Human immunodeficiency virus [HIV] disease: Secondary | ICD-10-CM

## 2019-08-18 DIAGNOSIS — Z9189 Other specified personal risk factors, not elsewhere classified: Secondary | ICD-10-CM | POA: Insufficient documentation

## 2019-08-18 DIAGNOSIS — F322 Major depressive disorder, single episode, severe without psychotic features: Secondary | ICD-10-CM | POA: Insufficient documentation

## 2019-08-18 MED ORDER — GENVOYA 150-150-200-10 MG PO TABS: 1.0000 | ORAL_TABLET | Freq: Every day | ORAL | 5 refills | Status: DC

## 2019-08-18 MED ORDER — FLUCONAZOLE 200 MG PO TABS: 200.0000 mg | ORAL_TABLET | Freq: Every day | ORAL | 0 refills | Status: DC

## 2019-08-18 MED ORDER — DARUNAVIR ETHANOLATE 800 MG PO TABS: ORAL_TABLET | ORAL | 5 refills | Status: DC

## 2019-08-18 MED ORDER — SULFAMETHOXAZOLE-TRIMETHOPRIM 800-160 MG PO TABS: 1.0000 | ORAL_TABLET | Freq: Every day | ORAL | 3 refills | Status: DC

## 2019-08-18 NOTE — Assessment & Plan Note (Signed)
Severe and has not been into care due to the depression.  Will get him into our counselor and mental health.  May benefit from pharmacologic treatment but will get him in mental health care first.

## 2019-08-18 NOTE — Assessment & Plan Note (Signed)
Will start bactrim for PCP prophylaxis

## 2019-08-18 NOTE — Assessment & Plan Note (Signed)
Will get him back on treatment and folllow him closely.  HMAP is approved.  Will follow up in 4 weeks and recheck the labs.

## 2019-08-18 NOTE — Progress Notes (Signed)
   Subjective:    Patient ID: Trevor Leon, male    DOB: [birth year], 54 y.o.   MRN: [id number]  HPI  Here for follow up of HIV Has been out of care and off of medication with his CD4 of 59 and viral load 76,600.  Has a known 184V mutation.  Has transaminitis on recent labs.  Previously on [place] and prezista. Genotype wild type confirming that he is off of medication. He is ready to restart.   He has been suffering with depression for about 2 years.  His family was finally able to convince him to get back into care.  He has not seen a mental health provider yet.      Review of Systems  Constitutional: Negative for fatigue.  Gastrointestinal: Negative for diarrhea and nausea.  Skin: Negative for rash.       Objective:   Physical Exam Constitutional:      Comments: Thin and chronically ill-appearing  Eyes:     General: No scleral icterus. Pulmonary:     Effort: Pulmonary effort is normal.  Abdominal:     Palpations: Abdomen is soft.  Neurological:     General: No focal deficit present.     Mental Status: He is alert.  Psychiatric:        Judgment: Judgment normal.     Comments: Flat affect No SI   SH: + tobacco        Assessment & Plan:

## 2019-08-26 ENCOUNTER — Ambulatory Visit: Payer: Self-pay

## 2019-09-02 ENCOUNTER — Ambulatory Visit: Payer: Self-pay

## 2019-09-22 ENCOUNTER — Ambulatory Visit (INDEPENDENT_AMBULATORY_CARE_PROVIDER_SITE_OTHER): Payer: Self-pay | Admitting: Internal Medicine

## 2019-09-22 ENCOUNTER — Other Ambulatory Visit: Payer: Self-pay

## 2019-09-22 ENCOUNTER — Encounter: Payer: Self-pay | Admitting: Internal Medicine

## 2019-09-22 VITALS — BP 110/68 | HR 87 | Temp 98.2°F | Wt 112.0 lb

## 2019-09-22 DIAGNOSIS — Z9189 Other specified personal risk factors, not elsewhere classified: Secondary | ICD-10-CM

## 2019-09-22 DIAGNOSIS — Z7189 Other specified counseling: Secondary | ICD-10-CM

## 2019-09-22 DIAGNOSIS — Z7185 Encounter for immunization safety counseling: Secondary | ICD-10-CM

## 2019-09-22 DIAGNOSIS — B20 Human immunodeficiency virus [HIV] disease: Secondary | ICD-10-CM

## 2019-09-22 NOTE — Progress Notes (Signed)
   Subjective:    Patient ID: Trevor Leon, male    DOB: May 20, 1965, 54 y.o.   MRN: [id number]  HPI Here for follow up of HIV. He was here last month after a prolonged absence and now back on his medication.  Has a known 184V mutation.  Back on his Genvoya and Prezista and no missed doses.  Already has gained 12 lbs.  No new concerns.  Has not yet had the COVID vaccine.    Review of Systems  Constitutional: Negative for fatigue and unexpected weight change.  Gastrointestinal: Negative for diarrhea and nausea.       Objective:   Physical Exam Constitutional:      Appearance: Normal appearance.  Eyes:     General: No scleral icterus. Cardiovascular:     Rate and Rhythm: Normal rate and regular rhythm.  Pulmonary:     Effort: Pulmonary effort is normal.  Neurological:     Mental Status: He is alert.  Psychiatric:        Mood and Affect: Mood normal.   SH: + tobacco        Assessment & Plan:

## 2019-09-23 LAB — T-HELPER CELL (CD4) - (RCID CLINIC ONLY)
CD4 % Helper T Cell: 13 % — ABNORMAL LOW (ref 33–65)
CD4 T Cell Abs: 337 /uL — ABNORMAL LOW (ref 400–1790)

## 2019-09-23 NOTE — Assessment & Plan Note (Signed)
He will continue with Bactrim OI prophylaxis 

## 2019-09-23 NOTE — Assessment & Plan Note (Signed)
Feeling better and improving on medications.   Will continue with his salvage regimen and check his labs today Otherwise can rtc in 3 months.

## 2019-09-23 NOTE — Assessment & Plan Note (Signed)
I did recommend the COVID vaccine and he will go and get it

## 2019-09-24 LAB — COMPLETE METABOLIC PANEL WITH GFR
AG Ratio: 1.2 (calc) (ref 1.0–2.5)
ALT: 16 U/L (ref 9–46)
AST: 23 U/L (ref 10–35)
Albumin: 3.4 g/dL — ABNORMAL LOW (ref 3.6–5.1)
Alkaline phosphatase (APISO): 87 U/L (ref 35–144)
BUN: 9 mg/dL (ref 7–25)
CO2: 25 mmol/L (ref 20–32)
Calcium: 8.8 mg/dL (ref 8.6–10.3)
Chloride: 104 mmol/L (ref 98–110)
Creat: 0.87 mg/dL (ref 0.70–1.33)
GFR, Est African American: 115 mL/min/{1.73_m2} (ref >=60)
GFR, Est Non African American: 99 mL/min/{1.73_m2} (ref >=60)
Globulin: 2.8 g/dL (calc) (ref 1.9–3.7)
Glucose, Bld: 105 mg/dL — ABNORMAL HIGH (ref 65–99)
Potassium: 4.3 mmol/L (ref 3.5–5.3)
Sodium: 135 mmol/L (ref 135–146)
Total Bilirubin: 0.3 mg/dL (ref 0.2–1.2)
Total Protein: 6.2 g/dL (ref 6.1–8.1)

## 2019-09-24 LAB — HIV-1 RNA QUANT-NO REFLEX-BLD
HIV 1 RNA Quant: 178 copies/mL — ABNORMAL HIGH
HIV-1 RNA Quant, Log: 2.25 Log copies/mL — ABNORMAL HIGH

## 2019-12-09 ENCOUNTER — Other Ambulatory Visit: Payer: Self-pay | Admitting: Internal Medicine

## 2019-12-23 ENCOUNTER — Other Ambulatory Visit: Payer: Self-pay

## 2019-12-23 ENCOUNTER — Encounter: Payer: Self-pay | Admitting: Internal Medicine

## 2019-12-23 ENCOUNTER — Ambulatory Visit (INDEPENDENT_AMBULATORY_CARE_PROVIDER_SITE_OTHER): Payer: Self-pay | Admitting: Internal Medicine

## 2019-12-23 VITALS — BP 122/79 | HR 79 | Wt 129.0 lb

## 2019-12-23 DIAGNOSIS — B20 Human immunodeficiency virus [HIV] disease: Secondary | ICD-10-CM

## 2019-12-23 DIAGNOSIS — F17200 Nicotine dependence, unspecified, uncomplicated: Secondary | ICD-10-CM

## 2019-12-23 NOTE — Assessment & Plan Note (Signed)
He remains precontemplative

## 2019-12-23 NOTE — Progress Notes (Signed)
° °  Subjective:    Patient ID: Trevor Leon, male    DOB: May 10, 1966, 54 y.o.   MRN: [id number]  HPI Here for follow up of HIV. He returns for follow up after restarting his medications.  He continues to do well now again with the Genvoya and Prezista and has had no missed doses.  He has gained his weight back and feels well.  He has had his COVID vaccine.    Review of Systems  Constitutional: Negative for fatigue and unexpected weight change.  Gastrointestinal: Negative for diarrhea and nausea.  Skin: Negative for rash.       Objective:   Physical Exam Constitutional:      Appearance: Normal appearance.  Eyes:     General: No scleral icterus. Cardiovascular:     Rate and Rhythm: Normal rate and regular rhythm.  Pulmonary:     Effort: Pulmonary effort is normal.  Neurological:     Mental Status: He is alert.  Psychiatric:        Mood and Affect: Mood normal.          Assessment & Plan:

## 2019-12-23 NOTE — Assessment & Plan Note (Signed)
He is doing well again and will confirm with labs.  He can rtc in 6 months unless there are concerns on the labs.

## 2019-12-24 LAB — T-HELPER CELL (CD4) - (RCID CLINIC ONLY)
CD4 % Helper T Cell: 14 % — ABNORMAL LOW (ref 33–65)
CD4 T Cell Abs: 196 /uL — ABNORMAL LOW (ref 400–1790)

## 2019-12-26 LAB — HIV-1 RNA QUANT-NO REFLEX-BLD
HIV 1 RNA Quant: 29 Copies/mL — ABNORMAL HIGH
HIV-1 RNA Quant, Log: 1.46 Log cps/mL — ABNORMAL HIGH

## 2020-01-03 ENCOUNTER — Ambulatory Visit: Payer: Self-pay

## 2020-01-13 ENCOUNTER — Other Ambulatory Visit: Payer: Self-pay

## 2020-01-13 ENCOUNTER — Ambulatory Visit: Payer: Self-pay

## 2020-01-14 ENCOUNTER — Encounter: Payer: Self-pay | Admitting: Internal Medicine

## 2020-02-24 ENCOUNTER — Other Ambulatory Visit: Payer: Self-pay | Admitting: Internal Medicine

## 2020-06-08 ENCOUNTER — Other Ambulatory Visit: Payer: Self-pay

## 2020-06-08 DIAGNOSIS — B20 Human immunodeficiency virus [HIV] disease: Secondary | ICD-10-CM

## 2020-06-08 DIAGNOSIS — Z113 Encounter for screening for infections with a predominantly sexual mode of transmission: Secondary | ICD-10-CM

## 2020-06-08 DIAGNOSIS — Z79899 Other long term (current) drug therapy: Secondary | ICD-10-CM

## 2020-06-12 ENCOUNTER — Other Ambulatory Visit: Payer: Self-pay

## 2020-06-12 ENCOUNTER — Encounter: Payer: Self-pay | Admitting: Internal Medicine

## 2020-06-12 DIAGNOSIS — Z79899 Other long term (current) drug therapy: Secondary | ICD-10-CM

## 2020-06-12 DIAGNOSIS — Z113 Encounter for screening for infections with a predominantly sexual mode of transmission: Secondary | ICD-10-CM

## 2020-06-12 DIAGNOSIS — B20 Human immunodeficiency virus [HIV] disease: Secondary | ICD-10-CM

## 2020-06-13 LAB — URINE CYTOLOGY ANCILLARY ONLY
Chlamydia: NEGATIVE
Comment: NEGATIVE
Comment: NORMAL
Neisseria Gonorrhea: NEGATIVE

## 2020-06-13 LAB — T-HELPER CELL (CD4) - (RCID CLINIC ONLY)
CD4 % Helper T Cell: 11 % — ABNORMAL LOW (ref 33–65)
CD4 T Cell Abs: 147 /uL — ABNORMAL LOW (ref 400–1790)

## 2020-06-15 LAB — CBC WITH DIFFERENTIAL/PLATELET
Absolute Monocytes: 625 cells/uL (ref 200–950)
Basophils Absolute: 50 cells/uL (ref 0–200)
Basophils Relative: 0.7 %
Eosinophils Absolute: 43 cells/uL (ref 15–500)
Eosinophils Relative: 0.6 %
HCT: 44.5 % (ref 38.5–50.0)
Hemoglobin: 16.2 g/dL (ref 13.2–17.1)
Lymphs Abs: 1534 cells/uL (ref 850–3900)
MCH: 39.2 pg — ABNORMAL HIGH (ref 27.0–33.0)
MCHC: 36.4 g/dL — ABNORMAL HIGH (ref 32.0–36.0)
MCV: 107.7 fL — ABNORMAL HIGH (ref 80.0–100.0)
MPV: 10.4 fL (ref 7.5–12.5)
Monocytes Relative: 8.8 %
Neutro Abs: 4849 cells/uL (ref 1500–7800)
Neutrophils Relative %: 68.3 %
Platelets: 142 10*3/uL (ref 140–400)
RBC: 4.13 10*6/uL — ABNORMAL LOW (ref 4.20–5.80)
RDW: 13.4 % (ref 11.0–15.0)
Total Lymphocyte: 21.6 %
WBC: 7.1 10*3/uL (ref 3.8–10.8)

## 2020-06-15 LAB — LIPID PANEL
Cholesterol: 266 mg/dL — ABNORMAL HIGH (ref <200)
HDL: 69 mg/dL (ref >=40)
LDL Cholesterol (Calc): 175 mg/dL (calc) — ABNORMAL HIGH
Non-HDL Cholesterol (Calc): 197 mg/dL (calc) — ABNORMAL HIGH (ref <130)
Total CHOL/HDL Ratio: 3.9 (calc) (ref <5.0)
Triglycerides: 98 mg/dL (ref <150)

## 2020-06-15 LAB — COMPLETE METABOLIC PANEL WITH GFR
AG Ratio: 1.3 (calc) (ref 1.0–2.5)
ALT: 54 U/L — ABNORMAL HIGH (ref 9–46)
AST: 80 U/L — ABNORMAL HIGH (ref 10–35)
Albumin: 4.4 g/dL (ref 3.6–5.1)
Alkaline phosphatase (APISO): 105 U/L (ref 35–144)
BUN: 10 mg/dL (ref 7–25)
CO2: 25 mmol/L (ref 20–32)
Calcium: 9.1 mg/dL (ref 8.6–10.3)
Chloride: 98 mmol/L (ref 98–110)
Creat: 0.83 mg/dL (ref 0.70–1.33)
GFR, Est African American: 117 mL/min/{1.73_m2} (ref >=60)
GFR, Est Non African American: 101 mL/min/{1.73_m2} (ref >=60)
Globulin: 3.4 g/dL (calc) (ref 1.9–3.7)
Glucose, Bld: 84 mg/dL (ref 65–99)
Potassium: 3.7 mmol/L (ref 3.5–5.3)
Sodium: 138 mmol/L (ref 135–146)
Total Bilirubin: 1.1 mg/dL (ref 0.2–1.2)
Total Protein: 7.8 g/dL (ref 6.1–8.1)

## 2020-06-15 LAB — RPR: RPR Ser Ql: REACTIVE — AB

## 2020-06-15 LAB — RPR TITER: RPR Titer: 1:1 {titer} — ABNORMAL HIGH

## 2020-06-15 LAB — HIV-1 RNA QUANT-NO REFLEX-BLD
HIV 1 RNA Quant: 162 Copies/mL — ABNORMAL HIGH
HIV-1 RNA Quant, Log: 2.21 Log cps/mL — ABNORMAL HIGH

## 2020-06-15 LAB — FLUORESCENT TREPONEMAL AB(FTA)-IGG-BLD: Fluorescent Treponemal ABS: REACTIVE — AB

## 2020-06-16 ENCOUNTER — Telehealth: Payer: Self-pay

## 2020-06-16 NOTE — Telephone Encounter (Signed)
No, it looks to be a false positive with his Ab minimally positive and last year was negative.  thanks

## 2020-06-16 NOTE — Telephone Encounter (Signed)
Health Department contacted our office in regards to positive RPR 06/12/20 on  for the patient.  Does the patient need to be treated?  Patient is not scheduled to come in for an office visit on 07/14/20.  Please advise Nareg Breighner T Pricilla Loveless

## 2020-06-16 NOTE — Telephone Encounter (Signed)
Health Department has been notified.

## 2020-06-26 ENCOUNTER — Encounter: Payer: Self-pay | Admitting: Internal Medicine

## 2020-07-14 ENCOUNTER — Encounter: Payer: Self-pay | Admitting: Internal Medicine

## 2020-07-14 ENCOUNTER — Other Ambulatory Visit: Payer: Self-pay

## 2020-07-14 ENCOUNTER — Ambulatory Visit (INDEPENDENT_AMBULATORY_CARE_PROVIDER_SITE_OTHER): Payer: Self-pay | Admitting: Internal Medicine

## 2020-07-14 VITALS — BP 130/74 | HR 78 | Temp 98.2°F | Wt 128.0 lb

## 2020-07-14 DIAGNOSIS — Z113 Encounter for screening for infections with a predominantly sexual mode of transmission: Secondary | ICD-10-CM

## 2020-07-14 DIAGNOSIS — Z5181 Encounter for therapeutic drug level monitoring: Secondary | ICD-10-CM

## 2020-07-14 DIAGNOSIS — Z23 Encounter for immunization: Secondary | ICD-10-CM

## 2020-07-14 DIAGNOSIS — Z9189 Other specified personal risk factors, not elsewhere classified: Secondary | ICD-10-CM

## 2020-07-14 DIAGNOSIS — R7401 Elevation of levels of liver transaminase levels: Secondary | ICD-10-CM | POA: Insufficient documentation

## 2020-07-14 DIAGNOSIS — F17200 Nicotine dependence, unspecified, uncomplicated: Secondary | ICD-10-CM

## 2020-07-14 DIAGNOSIS — B20 Human immunodeficiency virus [HIV] disease: Secondary | ICD-10-CM

## 2020-07-14 NOTE — Progress Notes (Signed)
   Subjective:    Patient ID: Trevor Leon, male    DOB: 02-07-1966, 55 y.o.   MRN: [id number]  HPI He is here for follow up of HIV He continues to be back on Genvoya and Prezista though does endorse continued issues with missed doses.  His CD4 reflects poor compliance at 147, which is down from previous and his viral load is 162.  LFTs also mildly elevated and he has been drinking more alcohol recently. He continues to smoke.  He has no particular complaints now.     Review of Systems  Gastrointestinal: Negative for diarrhea and nausea.  Skin: Negative for rash.       Objective:   Physical Exam Eyes:     General: No scleral icterus. Cardiovascular:     Rate and Rhythm: Normal rate.  Pulmonary:     Effort: Pulmonary effort is normal.  Neurological:     General: No focal deficit present.     Mental Status: He is alert.  Psychiatric:        Mood and Affect: Mood normal.   SH: + tobacco        Assessment & Plan:

## 2020-07-14 NOTE — Assessment & Plan Note (Signed)
He continues to struggle with compliance and is poorly controlled.  I did discussion the lab results and need to stay on track.  He is motivated to take his medications now daily so will have him follow up again in 6 months.  He knows to call if he continues to have issues with taking his two medications daily.

## 2020-07-14 NOTE — Assessment & Plan Note (Signed)
LFTs up again. He does endorse more alcohol.  I will recheck hepatitis labs next visit.  ? Fatty liver.

## 2020-07-14 NOTE — Progress Notes (Signed)
   Covid-19 Vaccination Clinic  Name:  Trevor Leon    MRN: [id number] DOB: [birth year]  07/14/2020  [name] was observed post Covid-19 immunization for 15 minutes without incident. He was provided with Vaccine Information Sheet and instruction to access the V-Safe system.   [name] was instructed to call 911 with any severe reactions post vaccine: Marland Kitchen Difficulty breathing  . Swelling of face and throat  . A fast heartbeat  . A bad rash all over body  . Dizziness and weakness

## 2020-07-14 NOTE — Assessment & Plan Note (Signed)
I again discussed the need to quit.  He is hopeful he can.

## 2020-07-14 NOTE — Assessment & Plan Note (Signed)
Since he continues to struggle with compliance, will not restart OI prophylaxis so he can focus on his two ARV pills.

## 2020-08-28 ENCOUNTER — Other Ambulatory Visit: Payer: Self-pay | Admitting: Internal Medicine

## 2021-01-16 ENCOUNTER — Other Ambulatory Visit: Payer: Self-pay | Admitting: Internal Medicine

## 2021-01-17 ENCOUNTER — Other Ambulatory Visit: Payer: Self-pay

## 2021-01-17 ENCOUNTER — Ambulatory Visit: Payer: Self-pay

## 2021-01-17 DIAGNOSIS — B20 Human immunodeficiency virus [HIV] disease: Secondary | ICD-10-CM

## 2021-01-17 DIAGNOSIS — R7401 Elevation of levels of liver transaminase levels: Secondary | ICD-10-CM

## 2021-01-18 LAB — T-HELPER CELL (CD4) - (RCID CLINIC ONLY)
CD4 % Helper T Cell: 14 % — ABNORMAL LOW (ref 33–65)
CD4 T Cell Abs: 200 /uL — ABNORMAL LOW (ref 400–1790)

## 2021-01-19 LAB — COMPLETE METABOLIC PANEL WITH GFR
AG Ratio: 1.5 (calc) (ref 1.0–2.5)
ALT: 50 U/L — ABNORMAL HIGH (ref 9–46)
AST: 84 U/L — ABNORMAL HIGH (ref 10–35)
Albumin: 4.4 g/dL (ref 3.6–5.1)
Alkaline phosphatase (APISO): 73 U/L (ref 35–144)
BUN: 10 mg/dL (ref 7–25)
CO2: 28 mmol/L (ref 20–32)
Calcium: 9.3 mg/dL (ref 8.6–10.3)
Chloride: 102 mmol/L (ref 98–110)
Creat: 0.95 mg/dL (ref 0.70–1.30)
Globulin: 3 g/dL (calc) (ref 1.9–3.7)
Glucose, Bld: 82 mg/dL (ref 65–99)
Potassium: 3.9 mmol/L (ref 3.5–5.3)
Sodium: 142 mmol/L (ref 135–146)
Total Bilirubin: 0.6 mg/dL (ref 0.2–1.2)
Total Protein: 7.4 g/dL (ref 6.1–8.1)
eGFR: 96 mL/min/{1.73_m2} (ref >=60)

## 2021-01-19 LAB — HEPATITIS C ANTIBODY
Hepatitis C Ab: NONREACTIVE
SIGNAL TO CUT-OFF: 0.03 (ref <1.00)

## 2021-01-19 LAB — HEPATITIS B SURFACE ANTIBODY,QUALITATIVE: Hep B S Ab: NONREACTIVE

## 2021-01-19 LAB — HIV-1 RNA QUANT-NO REFLEX-BLD
HIV 1 RNA Quant: 59 Copies/mL — ABNORMAL HIGH
HIV-1 RNA Quant, Log: 1.77 Log cps/mL — ABNORMAL HIGH

## 2021-01-19 LAB — HEPATITIS B SURFACE ANTIGEN: Hepatitis B Surface Ag: NONREACTIVE

## 2021-01-31 ENCOUNTER — Encounter: Payer: Self-pay | Admitting: Internal Medicine

## 2021-01-31 ENCOUNTER — Ambulatory Visit (INDEPENDENT_AMBULATORY_CARE_PROVIDER_SITE_OTHER): Payer: Self-pay | Admitting: Internal Medicine

## 2021-01-31 ENCOUNTER — Ambulatory Visit: Payer: Self-pay

## 2021-01-31 ENCOUNTER — Other Ambulatory Visit: Payer: Self-pay

## 2021-01-31 VITALS — BP 123/76 | HR 72 | Temp 97.7°F | Ht 66.0 in | Wt 127.0 lb

## 2021-01-31 DIAGNOSIS — Z9189 Other specified personal risk factors, not elsewhere classified: Secondary | ICD-10-CM

## 2021-01-31 DIAGNOSIS — Z79899 Other long term (current) drug therapy: Secondary | ICD-10-CM

## 2021-01-31 DIAGNOSIS — F17200 Nicotine dependence, unspecified, uncomplicated: Secondary | ICD-10-CM

## 2021-01-31 DIAGNOSIS — Z23 Encounter for immunization: Secondary | ICD-10-CM

## 2021-01-31 DIAGNOSIS — R7401 Elevation of levels of liver transaminase levels: Secondary | ICD-10-CM

## 2021-01-31 DIAGNOSIS — B20 Human immunodeficiency virus [HIV] disease: Secondary | ICD-10-CM

## 2021-01-31 MED ORDER — PFIZER COVID-19 VAC BIVALENT 30 MCG/0.3ML IM SUSP: 0.3000 mL | Freq: Once | INTRAMUSCULAR | 0 refills | Status: DC

## 2021-01-31 NOTE — Progress Notes (Signed)
   Subjective:    Patient ID: RANEY KOEPPEN, male    DOB: 1965-10-07, 55 y.o.   MRN: [id number]  HPI Here for follow up of HIV He continues on Genvoya and prezista with a history of a 184V mutation and has had no missed doses.  No new issues and CD4 up to 200 and viral load just 59 copies.  No new complaints.  Some continued alcohol intake and continues to smoke.  No issues with obtaining, taking or tolerating the medication.    Review of Systems  Constitutional:  Negative for fatigue.  Gastrointestinal:  Negative for diarrhea and nausea.  Skin:  Negative for rash.      Objective:   Physical Exam Eyes:     General: No scleral icterus. Pulmonary:     Effort: Pulmonary effort is normal.  Neurological:     General: No focal deficit present.     Mental Status: He is alert.  Psychiatric:        Mood and Affect: Mood normal.   SH: + tobacco, + alcohol       Assessment & Plan:

## 2021-01-31 NOTE — Assessment & Plan Note (Signed)
Discussed need for smoking cessation. 

## 2021-01-31 NOTE — Progress Notes (Signed)
   Covid-19 Vaccination Clinic  Name:  Trevor Leon    MRN: [id number] DOB: 06-18-1965  01/31/2021  Mr. Trevor Leon was observed post Covid-19 immunization for 15 minutes without incident. He was provided with Vaccine Information Sheet and instruction to access the V-Safe system.   Mr. Trevor Leon was instructed to call 911 with any severe reactions post vaccine: Difficulty breathing  Swelling of face and throat  A fast heartbeat  A bad rash all over body  Dizziness and weakness     Sandie Ano, RN

## 2021-01-31 NOTE — Assessment & Plan Note (Signed)
CD4 now 200 and he has had continued suppression of the viral load so no absolute indication for pneumocystis prophylaxis.  Will continue to monitor

## 2021-01-31 NOTE — Assessment & Plan Note (Signed)
Persistent inflammation and hepatitis B and C negative.  May be related to alcohol use or fatty liver. Discussed reduction of alcohol.

## 2021-02-05 ENCOUNTER — Encounter: Payer: Self-pay | Admitting: *Deleted

## 2021-02-12 ENCOUNTER — Encounter (INDEPENDENT_AMBULATORY_CARE_PROVIDER_SITE_OTHER): Payer: Self-pay | Admitting: *Deleted

## 2021-02-12 ENCOUNTER — Other Ambulatory Visit: Payer: Self-pay

## 2021-02-12 DIAGNOSIS — Z006 Encounter for examination for normal comparison and control in clinical research program: Secondary | ICD-10-CM

## 2021-02-12 NOTE — Research (Signed)
Trevor Leon was here to screen for the [employer]. He had taken the consent home with him and reviewed it prior to this visit. Informed consent was obtained after we discussed the study in detail and answered any questions he had. He denies any current problems but did note  a red rash to both of his forearms over the past year. They do not itch or bother him. He is unsure of how he contracted HIV and was diagnosed in 1998. He was sick at the time of diagnosis and his CD4 was 20 at the time. He does not recall being hospitalized for it then. He had also tested positive for Hep C antibody and had not been treated that he can remember. His Hep C VL was ND in 2013.  Enrollment is planned for 10/17 once eligibility is confirmed.

## 2021-02-13 LAB — COMPREHENSIVE METABOLIC PANEL
AG Ratio: 1.5 (calc) (ref 1.0–2.5)
ALT: 55 U/L — ABNORMAL HIGH (ref 9–46)
AST: 104 U/L — ABNORMAL HIGH (ref 10–35)
Albumin: 4.6 g/dL (ref 3.6–5.1)
Alkaline phosphatase (APISO): 74 U/L (ref 35–144)
BUN: 8 mg/dL (ref 7–25)
CO2: 26 mmol/L (ref 20–32)
Calcium: 9.2 mg/dL (ref 8.6–10.3)
Chloride: 104 mmol/L (ref 98–110)
Creat: 0.87 mg/dL (ref 0.70–1.30)
Globulin: 3 g/dL (calc) (ref 1.9–3.7)
Glucose, Bld: 75 mg/dL (ref 65–99)
Potassium: 3.6 mmol/L (ref 3.5–5.3)
Sodium: 144 mmol/L (ref 135–146)
Total Bilirubin: 0.7 mg/dL (ref 0.2–1.2)
Total Protein: 7.6 g/dL (ref 6.1–8.1)

## 2021-02-13 LAB — HIV-1/2 AB - DIFFERENTIATION
HIV-1 antibody: POSITIVE — AB
HIV-2 Ab: NEGATIVE

## 2021-02-13 LAB — HIV ANTIBODY (ROUTINE TESTING W REFLEX): HIV 1&2 Ab, 4th Generation: REACTIVE — AB

## 2021-02-13 LAB — HEPATITIS B SURFACE ANTIGEN: Hepatitis B Surface Ag: NONREACTIVE

## 2021-02-13 LAB — PHOSPHORUS: Phosphorus: 3.8 mg/dL (ref 2.5–4.5)

## 2021-02-13 LAB — BILIRUBIN, DIRECT: Bilirubin, Direct: 0.2 mg/dL (ref 0.0–0.2)

## 2021-02-13 LAB — HEPATITIS B SURFACE ANTIBODY,QUALITATIVE: Hep B S Ab: NONREACTIVE

## 2021-02-13 LAB — HEPATITIS B CORE ANTIBODY, TOTAL: Hep B Core Total Ab: NONREACTIVE

## 2021-02-13 LAB — PROTIME-INR
INR: 1.1
Prothrombin Time: 10.7 s (ref 9.0–11.5)

## 2021-02-13 LAB — CK: Total CK: 119 U/L (ref 44–196)

## 2021-02-19 ENCOUNTER — Other Ambulatory Visit: Payer: Self-pay | Admitting: Infectious Diseases

## 2021-02-19 ENCOUNTER — Encounter: Payer: Self-pay | Admitting: *Deleted

## 2021-02-26 ENCOUNTER — Encounter: Payer: Self-pay | Admitting: *Deleted

## 2021-08-22 ENCOUNTER — Ambulatory Visit: Payer: Self-pay

## 2021-08-22 ENCOUNTER — Other Ambulatory Visit: Payer: Self-pay

## 2021-09-05 ENCOUNTER — Other Ambulatory Visit: Payer: Self-pay

## 2021-09-19 ENCOUNTER — Other Ambulatory Visit: Payer: Self-pay

## 2021-09-19 ENCOUNTER — Ambulatory Visit: Payer: Self-pay | Admitting: Internal Medicine

## 2021-09-19 DIAGNOSIS — Z79899 Other long term (current) drug therapy: Secondary | ICD-10-CM

## 2021-09-19 DIAGNOSIS — B20 Human immunodeficiency virus [HIV] disease: Secondary | ICD-10-CM

## 2021-09-20 LAB — T-HELPER CELL (CD4) - (RCID CLINIC ONLY)
CD4 % Helper T Cell: 14 % — ABNORMAL LOW (ref 33–65)
CD4 T Cell Abs: 305 /uL — ABNORMAL LOW (ref 400–1790)

## 2021-09-21 LAB — LIPID PANEL
Cholesterol: 210 mg/dL — ABNORMAL HIGH (ref <200)
HDL: 42 mg/dL (ref >=40)
LDL Cholesterol (Calc): 129 mg/dL (calc) — ABNORMAL HIGH
Non-HDL Cholesterol (Calc): 168 mg/dL (calc) — ABNORMAL HIGH (ref <130)
Total CHOL/HDL Ratio: 5 (calc) — ABNORMAL HIGH (ref <5.0)
Triglycerides: 244 mg/dL — ABNORMAL HIGH (ref <150)

## 2021-09-21 LAB — HIV-1 RNA QUANT-NO REFLEX-BLD
HIV 1 RNA Quant: 295 copies/mL — ABNORMAL HIGH
HIV-1 RNA Quant, Log: 2.47 Log copies/mL — ABNORMAL HIGH

## 2021-10-03 ENCOUNTER — Ambulatory Visit: Payer: Self-pay | Admitting: Internal Medicine

## 2021-10-03 ENCOUNTER — Other Ambulatory Visit: Payer: Self-pay

## 2021-10-03 ENCOUNTER — Encounter: Payer: Self-pay | Admitting: Internal Medicine

## 2021-10-03 VITALS — BP 132/84 | HR 68 | Temp 97.9°F | Wt 125.8 lb

## 2021-10-03 DIAGNOSIS — Z113 Encounter for screening for infections with a predominantly sexual mode of transmission: Secondary | ICD-10-CM

## 2021-10-03 DIAGNOSIS — Z9189 Other specified personal risk factors, not elsewhere classified: Secondary | ICD-10-CM

## 2021-10-03 DIAGNOSIS — Z5181 Encounter for therapeutic drug level monitoring: Secondary | ICD-10-CM

## 2021-10-03 DIAGNOSIS — F4321 Adjustment disorder with depressed mood: Secondary | ICD-10-CM

## 2021-10-03 DIAGNOSIS — B20 Human immunodeficiency virus [HIV] disease: Secondary | ICD-10-CM

## 2021-10-03 NOTE — Assessment & Plan Note (Signed)
He is experiencing grief with the loss of his wife.  Has a good support system and no concerns at this time.  He is aware of our counseling services.

## 2021-10-03 NOTE — Progress Notes (Signed)
   Subjective:    Patient ID: ALA CAPRI, male    DOB: [birth year], 56 y.o.   MRN: [id number]  HPI Here for follow up of HIV He is here for routine follow up on prezista and Genvoya with his history of a 184v mutation.  He unfortunately lost his wife somewhat unexpectedly since I last saw him and has been struggling with keeping up with everything, including his renewal for [employer].  He was out of medication recently but back on over 1 month now.  He was in counseling and feels he is now in a better place with good family support.  He continues to care for his special needs brother in law who lives with him and has for about 6 years.     Review of Systems  Constitutional:  Negative for fatigue.  Gastrointestinal:  Negative for diarrhea.  Skin:  Negative for rash.      Objective:   Physical Exam Eyes:     General: No scleral icterus. Pulmonary:     Effort: Pulmonary effort is normal.  Neurological:     General: No focal deficit present.     Mental Status: He is alert.  Psychiatric:        Mood and Affect: Mood normal.   SH: + tobacco       Assessment & Plan:

## 2021-10-03 NOTE — Assessment & Plan Note (Signed)
He does have a bit of a blip on his viral load, likely from being off recently but now has been back on his ARVs about 6 weeks so will recheck the viral load.  His CD4 is good at 305 though.

## 2021-10-03 NOTE — Assessment & Plan Note (Signed)
His CD4 has remained up now x 2 so no indication for OI prophylaxis at this time

## 2021-10-06 LAB — HIV-1 RNA ULTRAQUANT REFLEX TO GENTYP+
HIV 1 RNA Quant: 51 copies/mL — ABNORMAL HIGH
HIV-1 RNA Quant, Log: 1.71 Log copies/mL — ABNORMAL HIGH

## 2021-10-14 ENCOUNTER — Encounter: Payer: Self-pay | Admitting: Infectious Diseases

## 2021-11-10 ENCOUNTER — Other Ambulatory Visit: Payer: Self-pay | Admitting: Internal Medicine

## 2022-01-07 ENCOUNTER — Other Ambulatory Visit: Payer: Self-pay

## 2022-01-07 ENCOUNTER — Ambulatory Visit: Payer: Self-pay

## 2022-02-07 ENCOUNTER — Telehealth: Payer: Self-pay

## 2022-02-07 ENCOUNTER — Other Ambulatory Visit: Payer: Self-pay | Admitting: Internal Medicine

## 2022-02-07 NOTE — Telephone Encounter (Signed)
Patient called office today stating she is not able to fill his Prezista at [facility]. Was told that no refills were on file. Has been off this for 1 1/2- 1 week. Has been taking Genvoya only.  Advised patient to hold off on Genvoya until he can refill prezista.  Called [facility] regarding this. Spoke with Langley Gauss who states that they did not receive prescription for medication in. Was able to take to verbal.  Will have prescription ready today with $0 copay. Leatrice Jewels, RMA

## 2022-03-13 ENCOUNTER — Other Ambulatory Visit: Payer: Self-pay

## 2022-03-13 DIAGNOSIS — Z113 Encounter for screening for infections with a predominantly sexual mode of transmission: Secondary | ICD-10-CM

## 2022-03-13 DIAGNOSIS — B20 Human immunodeficiency virus [HIV] disease: Secondary | ICD-10-CM

## 2022-03-14 LAB — T-HELPER CELL (CD4) - (RCID CLINIC ONLY)
CD4 % Helper T Cell: 18 % — ABNORMAL LOW (ref 33–65)
CD4 T Cell Abs: 321 /uL — ABNORMAL LOW (ref 400–1790)

## 2022-03-15 LAB — COMPLETE METABOLIC PANEL WITH GFR
AG Ratio: 1.1 (calc) (ref 1.0–2.5)
ALT: 16 U/L (ref 9–46)
AST: 28 U/L (ref 10–35)
Albumin: 3.9 g/dL (ref 3.6–5.1)
Alkaline phosphatase (APISO): 64 U/L (ref 35–144)
BUN: 8 mg/dL (ref 7–25)
CO2: 25 mmol/L (ref 20–32)
Calcium: 9 mg/dL (ref 8.6–10.3)
Chloride: 104 mmol/L (ref 98–110)
Creat: 0.96 mg/dL (ref 0.70–1.30)
Globulin: 3.4 g/dL (calc) (ref 1.9–3.7)
Glucose, Bld: 75 mg/dL (ref 65–99)
Potassium: 3.9 mmol/L (ref 3.5–5.3)
Sodium: 139 mmol/L (ref 135–146)
Total Bilirubin: 0.7 mg/dL (ref 0.2–1.2)
Total Protein: 7.3 g/dL (ref 6.1–8.1)
eGFR: 94 mL/min/{1.73_m2} (ref >=60)

## 2022-03-15 LAB — CBC WITH DIFFERENTIAL/PLATELET
Absolute Monocytes: 612 cells/uL (ref 200–950)
Basophils Absolute: 61 cells/uL (ref 0–200)
Basophils Relative: 0.9 %
Eosinophils Absolute: 231 cells/uL (ref 15–500)
Eosinophils Relative: 3.4 %
HCT: 43 % (ref 38.5–50.0)
Hemoglobin: 15.2 g/dL (ref 13.2–17.1)
Lymphs Abs: 2135 cells/uL (ref 850–3900)
MCH: 36.8 pg — ABNORMAL HIGH (ref 27.0–33.0)
MCHC: 35.3 g/dL (ref 32.0–36.0)
MCV: 104.1 fL — ABNORMAL HIGH (ref 80.0–100.0)
MPV: 11.6 fL (ref 7.5–12.5)
Monocytes Relative: 9 %
Neutro Abs: 3760 cells/uL (ref 1500–7800)
Neutrophils Relative %: 55.3 %
Platelets: 165 10*3/uL (ref 140–400)
RBC: 4.13 10*6/uL — ABNORMAL LOW (ref 4.20–5.80)
RDW: 13.1 % (ref 11.0–15.0)
Total Lymphocyte: 31.4 %
WBC: 6.8 10*3/uL (ref 3.8–10.8)

## 2022-03-15 LAB — HIV-1 RNA QUANT-NO REFLEX-BLD
HIV 1 RNA Quant: 35 Copies/mL — ABNORMAL HIGH
HIV-1 RNA Quant, Log: 1.54 Log cps/mL — ABNORMAL HIGH

## 2022-03-15 LAB — FLUORESCENT TREPONEMAL AB(FTA)-IGG-BLD: Fluorescent Treponemal ABS: NONREACTIVE

## 2022-03-15 LAB — RPR: RPR Ser Ql: REACTIVE — AB

## 2022-03-15 LAB — RPR TITER: RPR Titer: 1:1 {titer} — ABNORMAL HIGH

## 2022-03-27 ENCOUNTER — Ambulatory Visit (INDEPENDENT_AMBULATORY_CARE_PROVIDER_SITE_OTHER): Payer: Self-pay | Admitting: Internal Medicine

## 2022-03-27 ENCOUNTER — Ambulatory Visit (INDEPENDENT_AMBULATORY_CARE_PROVIDER_SITE_OTHER): Payer: Self-pay

## 2022-03-27 ENCOUNTER — Encounter: Payer: Self-pay | Admitting: Internal Medicine

## 2022-03-27 ENCOUNTER — Other Ambulatory Visit: Payer: Self-pay

## 2022-03-27 VITALS — BP 125/75 | HR 71 | Temp 97.4°F | Ht 66.0 in | Wt 126.0 lb

## 2022-03-27 DIAGNOSIS — Z113 Encounter for screening for infections with a predominantly sexual mode of transmission: Secondary | ICD-10-CM

## 2022-03-27 DIAGNOSIS — R7401 Elevation of levels of liver transaminase levels: Secondary | ICD-10-CM

## 2022-03-27 DIAGNOSIS — B20 Human immunodeficiency virus [HIV] disease: Secondary | ICD-10-CM

## 2022-03-27 DIAGNOSIS — Z23 Encounter for immunization: Secondary | ICD-10-CM

## 2022-03-27 DIAGNOSIS — Z5181 Encounter for therapeutic drug level monitoring: Secondary | ICD-10-CM

## 2022-03-27 DIAGNOSIS — Z79899 Other long term (current) drug therapy: Secondary | ICD-10-CM

## 2022-03-27 MED ORDER — DARUNAVIR 800 MG PO TABS: 800.0000 mg | ORAL_TABLET | Freq: Every day | ORAL | 11 refills | Status: DC

## 2022-03-27 MED ORDER — GENVOYA 150-150-200-10 MG PO TABS: 1.0000 | ORAL_TABLET | Freq: Every day | ORAL | 11 refills | Status: DC

## 2022-03-27 MED ORDER — PRAVASTATIN SODIUM 10 MG PO TABS: 10.0000 mg | ORAL_TABLET | Freq: Every day | ORAL | 11 refills | Status: DC

## 2022-03-27 NOTE — Assessment & Plan Note (Signed)
Screened negative Low risk 

## 2022-03-27 NOTE — Assessment & Plan Note (Signed)
LFTs normal now.  Will continue to monitor

## 2022-03-27 NOTE — Progress Notes (Signed)
   Subjective:    Patient ID: Trevor Leon, male    DOB: Jan 03, 1966, 56 y.o.   MRN: [id number]  HPI Aydian is here for routine follow up of HIV He has been on [place] and Prezista with a history of a 184v mutation and has been doing well.  He continues grieving with the loss of his wife as expected but no si or other concerns.  No missed doses.     Review of Systems  Constitutional:  Negative for fatigue.  Gastrointestinal:  Negative for diarrhea.  Skin:  Negative for rash.       Objective:   Physical Exam Eyes:     General: No scleral icterus. Pulmonary:     Effort: Pulmonary effort is normal.  Skin:    Findings: No rash.  Neurological:     General: No focal deficit present.     Mental Status: He is alert.  Psychiatric:        Mood and Affect: Mood normal.    SH": + tobacco       Assessment & Plan:

## 2022-03-27 NOTE — Addendum Note (Signed)
Addended by: Philippa Chester on: 03/27/2022 11:48 AM   Modules accepted: Orders

## 2022-03-27 NOTE — Assessment & Plan Note (Signed)
Creat, LFTs wnl.  

## 2022-03-27 NOTE — Assessment & Plan Note (Addendum)
He continues to do well on his regimen and will continue with the same.  Reviewed the labs with him and all good.  CD4 has remained above 200 so no prophylaxis indicated.   With new research coming out by way of the REPRIEVE study regarding cardiovascular event risk reduction for PLWH, I discussed that over the 8 year trial initiation of statin (pitavastatin) therapy was shown to reduce CV disease by 35% independent of other risk factors.   The 10-year ASCVD risk score (Arnett DK, et al., 2019) is: 11.6%   Values used to calculate the score:     Age: 56 years     Sex: Male     Is Non-Hispanic African American: No     Diabetic: No     Tobacco smoker: Yes     Systolic Blood Pressure: 125 mmHg     Is BP treated: No     HDL Cholesterol: 42 mg/dL     Total Cholesterol: 210 mg/dL  We discussed the patient's 10-year CVD Risk Score to be at least moderately elevated, and would benefit from intervention given HIV+ and > 55 yo.   Will proceed with lower-intensity statin given non-diabetic.

## 2022-09-11 ENCOUNTER — Other Ambulatory Visit: Payer: Self-pay

## 2022-09-11 ENCOUNTER — Other Ambulatory Visit: Payer: Medicaid Other

## 2022-09-11 DIAGNOSIS — B20 Human immunodeficiency virus [HIV] disease: Secondary | ICD-10-CM

## 2022-09-11 DIAGNOSIS — Z113 Encounter for screening for infections with a predominantly sexual mode of transmission: Secondary | ICD-10-CM

## 2022-09-11 DIAGNOSIS — Z79899 Other long term (current) drug therapy: Secondary | ICD-10-CM

## 2022-09-11 LAB — CBC WITH DIFFERENTIAL/PLATELET
Lymphs Abs: 2108 cells/uL (ref 850–3900)
MCV: 102.7 fL — ABNORMAL HIGH (ref 80.0–100.0)
Monocytes Relative: 12.2 %
Neutrophils Relative %: 53.8 %
RBC: 3.73 10*6/uL — ABNORMAL LOW (ref 4.20–5.80)
RDW: 13.1 % (ref 11.0–15.0)
Total Lymphocyte: 31 %

## 2022-09-12 LAB — LIPID PANEL
Cholesterol: 170 mg/dL (ref <200)
Non-HDL Cholesterol (Calc): 124 mg/dL (calc) (ref <130)
Total CHOL/HDL Ratio: 3.7 (calc) (ref <5.0)

## 2022-09-12 LAB — COMPLETE METABOLIC PANEL WITH GFR
AG Ratio: 1.2 (calc) (ref 1.0–2.5)
AST: 16 U/L (ref 10–35)
Albumin: 3.6 g/dL (ref 3.6–5.1)
Glucose, Bld: 118 mg/dL — ABNORMAL HIGH (ref 65–99)
Potassium: 3.6 mmol/L (ref 3.5–5.3)

## 2022-09-13 LAB — CBC WITH DIFFERENTIAL/PLATELET
Absolute Monocytes: 830 cells/uL (ref 200–950)
Basophils Absolute: 48 cells/uL (ref 0–200)
Basophils Relative: 0.7 %
Eosinophils Absolute: 156 cells/uL (ref 15–500)
Eosinophils Relative: 2.3 %
HCT: 38.3 % — ABNORMAL LOW (ref 38.5–50.0)
Hemoglobin: 13.2 g/dL (ref 13.2–17.1)
MCH: 35.4 pg — ABNORMAL HIGH (ref 27.0–33.0)
MCHC: 34.5 g/dL (ref 32.0–36.0)
MPV: 11.3 fL (ref 7.5–12.5)
Neutro Abs: 3658 cells/uL (ref 1500–7800)
Platelets: 219 10*3/uL (ref 140–400)
WBC: 6.8 10*3/uL (ref 3.8–10.8)

## 2022-09-13 LAB — COMPLETE METABOLIC PANEL WITH GFR
ALT: 11 U/L (ref 9–46)
Alkaline phosphatase (APISO): 73 U/L (ref 35–144)
BUN: 12 mg/dL (ref 7–25)
CO2: 25 mmol/L (ref 20–32)
Calcium: 8.8 mg/dL (ref 8.6–10.3)
Chloride: 105 mmol/L (ref 98–110)
Creat: 1.04 mg/dL (ref 0.70–1.30)
Globulin: 3 g/dL (calc) (ref 1.9–3.7)
Sodium: 139 mmol/L (ref 135–146)
Total Bilirubin: 0.4 mg/dL (ref 0.2–1.2)
Total Protein: 6.6 g/dL (ref 6.1–8.1)
eGFR: 85 mL/min/{1.73_m2} (ref >=60)

## 2022-09-13 LAB — LIPID PANEL
HDL: 46 mg/dL (ref >=40)
LDL Cholesterol (Calc): 94 mg/dL (calc)
Triglycerides: 199 mg/dL — ABNORMAL HIGH (ref <150)

## 2022-09-13 LAB — T-HELPER CELL (CD4) - (RCID CLINIC ONLY)
CD4 % Helper T Cell: 17 % — ABNORMAL LOW (ref 33–65)
CD4 T Cell Abs: 344 /uL — ABNORMAL LOW (ref 400–1790)

## 2022-09-13 LAB — T PALLIDUM AB: T Pallidum Abs: NEGATIVE

## 2022-09-13 LAB — CK: Total CK: 84 U/L (ref 44–196)

## 2022-09-13 LAB — HIV-1 RNA QUANT-NO REFLEX-BLD
HIV 1 RNA Quant: 91 Copies/mL — ABNORMAL HIGH
HIV-1 RNA Quant, Log: 1.96 Log cps/mL — ABNORMAL HIGH

## 2022-09-13 LAB — RPR: RPR Ser Ql: REACTIVE — AB

## 2022-09-13 LAB — RPR TITER: RPR Titer: 1:1 {titer} — ABNORMAL HIGH

## 2022-09-25 ENCOUNTER — Encounter: Payer: Self-pay | Admitting: Internal Medicine

## 2022-09-25 ENCOUNTER — Ambulatory Visit: Payer: Medicaid Other | Admitting: Internal Medicine

## 2022-09-25 ENCOUNTER — Other Ambulatory Visit: Payer: Self-pay

## 2022-09-25 VITALS — BP 133/87 | HR 80 | Temp 97.7°F | Wt 125.2 lb

## 2022-09-25 DIAGNOSIS — F17200 Nicotine dependence, unspecified, uncomplicated: Secondary | ICD-10-CM

## 2022-09-25 DIAGNOSIS — Z113 Encounter for screening for infections with a predominantly sexual mode of transmission: Secondary | ICD-10-CM | POA: Diagnosis not present

## 2022-09-25 DIAGNOSIS — Z79899 Other long term (current) drug therapy: Secondary | ICD-10-CM | POA: Diagnosis not present

## 2022-09-25 DIAGNOSIS — B20 Human immunodeficiency virus [HIV] disease: Secondary | ICD-10-CM

## 2022-09-25 DIAGNOSIS — Z5181 Encounter for therapeutic drug level monitoring: Secondary | ICD-10-CM

## 2022-09-25 NOTE — Assessment & Plan Note (Signed)
Iipid panel reviewed with him and continues on a statin per the Reprieve study. No concerns.

## 2022-09-25 NOTE — Progress Notes (Signed)
   Subjective:    Patient ID: Trevor Leon, male    DOB: [birth year], 57 y.o.   MRN: [id number]  HPI Trevor Leon is here for follow up of HIV He continues on [place] and Prezista and doing well.  He did miss some doses around 07-22-22 after his sister died and was feeling depressed about that and other recent deaths in his family.  No issues with the statin, no muscle aches.     Review of Systems  Constitutional:  Negative for fatigue.  Gastrointestinal:  Negative for diarrhea.       Objective:   Physical Exam Eyes:     General: No scleral icterus. Pulmonary:     Effort: Pulmonary effort is normal.  Neurological:     Mental Status: He is alert.   SH: + tobacco        Assessment & Plan:

## 2022-09-25 NOTE — Assessment & Plan Note (Signed)
Screened negative, low risk.  

## 2022-09-25 NOTE — Assessment & Plan Note (Signed)
LFts, creat, wnl

## 2022-09-25 NOTE — Assessment & Plan Note (Signed)
He is doing well on his salvage regimen with no concerns.  Labs reviewed with him and no changes indicated.  Emphasized continued compliance. Follow up in 6 months.

## 2022-09-25 NOTE — Assessment & Plan Note (Signed)
Discussed cessation and he understands the importance of quitting.

## 2023-02-10 ENCOUNTER — Other Ambulatory Visit: Payer: Self-pay | Admitting: Internal Medicine

## 2023-03-23 ENCOUNTER — Other Ambulatory Visit: Payer: Self-pay | Admitting: Internal Medicine

## 2023-04-02 ENCOUNTER — Ambulatory Visit: Payer: Medicaid Other | Admitting: Internal Medicine

## 2023-04-09 ENCOUNTER — Other Ambulatory Visit: Payer: Medicaid Other

## 2023-04-09 ENCOUNTER — Other Ambulatory Visit: Payer: Self-pay

## 2023-04-09 ENCOUNTER — Telehealth: Payer: Self-pay | Admitting: Internal Medicine

## 2023-04-09 DIAGNOSIS — B20 Human immunodeficiency virus [HIV] disease: Secondary | ICD-10-CM

## 2023-04-09 MED ORDER — DARUNAVIR 800 MG PO TABS: 800.0000 mg | ORAL_TABLET | Freq: Every day | ORAL | 0 refills | Status: DC

## 2023-04-09 MED ORDER — GENVOYA 150-150-200-10 MG PO TABS: 1.0000 | ORAL_TABLET | Freq: Every day | ORAL | 0 refills | Status: DC

## 2023-04-09 NOTE — Telephone Encounter (Signed)
Ilyass requested a refill on all three prescriptions. He is completing labs today and will see Dr. Luciana Axe 12/19. Refills can be sent to [facility] on [address].

## 2023-04-09 NOTE — Telephone Encounter (Signed)
Refills sent

## 2023-04-11 LAB — T-HELPER CELLS (CD4) COUNT (NOT AT ARMC)
Absolute CD4: 327 {cells}/uL — ABNORMAL LOW (ref 490–1740)
CD4 T Helper %: 16 % — ABNORMAL LOW (ref 30–61)
Total lymphocyte count: 2099 {cells}/uL (ref 850–3900)

## 2023-04-11 LAB — HIV-1 RNA QUANT-NO REFLEX-BLD
HIV 1 RNA Quant: <20 {copies}/mL — ABNORMAL HIGH
HIV-1 RNA Quant, Log: <1.3 {Log_copies}/mL — ABNORMAL HIGH

## 2023-04-17 ENCOUNTER — Other Ambulatory Visit: Payer: Self-pay | Admitting: Internal Medicine

## 2023-04-17 NOTE — Telephone Encounter (Signed)
Refill was sent 11/27, additional refills to be provided at 12/19 appt.   Sandie Ano, RN

## 2023-05-01 ENCOUNTER — Other Ambulatory Visit: Payer: Self-pay

## 2023-05-01 ENCOUNTER — Ambulatory Visit: Payer: Medicaid Other | Admitting: Internal Medicine

## 2023-05-01 ENCOUNTER — Encounter: Payer: Self-pay | Admitting: Internal Medicine

## 2023-05-01 VITALS — BP 139/83 | HR 92 | Temp 97.5°F | Ht 66.5 in | Wt 130.0 lb

## 2023-05-01 DIAGNOSIS — F17200 Nicotine dependence, unspecified, uncomplicated: Secondary | ICD-10-CM

## 2023-05-01 DIAGNOSIS — B20 Human immunodeficiency virus [HIV] disease: Secondary | ICD-10-CM

## 2023-05-01 DIAGNOSIS — Z23 Encounter for immunization: Secondary | ICD-10-CM | POA: Diagnosis not present

## 2023-05-01 DIAGNOSIS — Z79899 Other long term (current) drug therapy: Secondary | ICD-10-CM | POA: Diagnosis not present

## 2023-05-01 DIAGNOSIS — Z113 Encounter for screening for infections with a predominantly sexual mode of transmission: Secondary | ICD-10-CM | POA: Diagnosis not present

## 2023-05-01 NOTE — Assessment & Plan Note (Addendum)
Will screen next visit.  

## 2023-05-01 NOTE — Progress Notes (Signed)
   Subjective:    Patient ID: Trevor Leon, male    DOB: 08/01/1965, 57 y.o.   MRN: [id number]  HPI Trevor Leon is here for follow up of HIV He continues on Genvoya and darunavir and denies any missed doses.  No issues with getting or taking his medication.  No complaints today.  Has family staying with him and continues to take care of his late wife's special needs brother.  Will be spending Christmas with his 6 grown kids and some grandchildren.    Review of Systems  Constitutional:  Negative for fatigue.  Gastrointestinal:  Negative for diarrhea and nausea.  Skin:  Negative for rash.       Objective:   Physical Exam Eyes:     General: No scleral icterus. Pulmonary:     Effort: Pulmonary effort is normal.  Neurological:     Mental Status: He is alert.    SH: + tobacco       Assessment & Plan:

## 2023-05-01 NOTE — Assessment & Plan Note (Signed)
Discussed flu and COVID vaccine and both given today

## 2023-05-01 NOTE — Assessment & Plan Note (Signed)
He continues to do well on his salvage regimen and no changes indicated.  He has no issues with the medications.  Labs reviewed with him.  Follow up in 6 months.

## 2023-05-01 NOTE — Assessment & Plan Note (Signed)
Discussed cessation and he is contemplative. He has been able to abstain with nicotine gum while at work and will try to extend that.

## 2023-05-14 ENCOUNTER — Other Ambulatory Visit: Payer: Self-pay | Admitting: Internal Medicine

## 2023-06-09 ENCOUNTER — Other Ambulatory Visit: Payer: Self-pay

## 2023-06-09 ENCOUNTER — Emergency Department (HOSPITAL_COMMUNITY)
Admission: EM | Admit: 2023-06-09 | Discharge: 2023-06-09 | Disposition: A | Discharge status: Home / Self Care | Payer: Medicaid Other | Attending: Emergency Medicine | Admitting: Emergency Medicine

## 2023-06-09 ENCOUNTER — Emergency Department (HOSPITAL_COMMUNITY): Payer: Medicaid Other

## 2023-06-09 DIAGNOSIS — R220 Localized swelling, mass and lump, head: Secondary | ICD-10-CM | POA: Insufficient documentation

## 2023-06-09 DIAGNOSIS — Z21 Asymptomatic human immunodeficiency virus [HIV] infection status: Secondary | ICD-10-CM | POA: Insufficient documentation

## 2023-06-09 DIAGNOSIS — F17200 Nicotine dependence, unspecified, uncomplicated: Secondary | ICD-10-CM | POA: Insufficient documentation

## 2023-06-09 LAB — CBC WITH DIFFERENTIAL/PLATELET
Abs Immature Granulocytes: 0.02 10*3/uL (ref 0.00–0.07)
Basophils Absolute: 0.1 10*3/uL (ref 0.0–0.1)
Basophils Relative: 1 %
Eosinophils Absolute: 0.2 10*3/uL (ref 0.0–0.5)
Eosinophils Relative: 2 %
HCT: 39.1 % (ref 39.0–52.0)
Hemoglobin: 13.4 g/dL (ref 13.0–17.0)
Immature Granulocytes: 0 %
Lymphocytes Relative: 19 %
Lymphs Abs: 1.9 10*3/uL (ref 0.7–4.0)
MCH: 37.4 pg — ABNORMAL HIGH (ref 26.0–34.0)
MCHC: 34.3 g/dL (ref 30.0–36.0)
MCV: 109.2 fL — ABNORMAL HIGH (ref 80.0–100.0)
Monocytes Absolute: 1.4 10*3/uL — ABNORMAL HIGH (ref 0.1–1.0)
Monocytes Relative: 14 %
Neutro Abs: 6.4 10*3/uL (ref 1.7–7.7)
Neutrophils Relative %: 64 %
Platelets: 396 10*3/uL (ref 150–400)
RBC: 3.58 MIL/uL — ABNORMAL LOW (ref 4.22–5.81)
RDW: 13.9 % (ref 11.5–15.5)
WBC: 10 10*3/uL (ref 4.0–10.5)
nRBC: 0 % (ref 0.0–0.2)

## 2023-06-09 LAB — BASIC METABOLIC PANEL
Anion gap: 10 (ref 5–15)
BUN: <5 mg/dL — ABNORMAL LOW (ref 6–20)
CO2: 23 mmol/L (ref 22–32)
Calcium: 8.8 mg/dL — ABNORMAL LOW (ref 8.9–10.3)
Chloride: 105 mmol/L (ref 98–111)
Creatinine, Ser: 0.99 mg/dL (ref 0.61–1.24)
GFR, Estimated: >60 mL/min (ref >60)
Glucose, Bld: 96 mg/dL (ref 70–99)
Potassium: 3.6 mmol/L (ref 3.5–5.1)
Sodium: 138 mmol/L (ref 135–145)

## 2023-06-09 MED ORDER — HYDROCODONE-ACETAMINOPHEN 5-325 MG PO TABS: 1.0000 | ORAL_TABLET | Freq: Four times a day (QID) | ORAL | 0 refills | Status: AC | PRN

## 2023-06-09 MED ORDER — IOHEXOL 350 MG/ML SOLN
75.0000 mL | Freq: Once | INTRAVENOUS | Status: AC | PRN
  Administered 2023-06-09: 75 mL via INTRAVENOUS

## 2023-06-09 NOTE — ED Notes (Signed)
Patient discharged home. VSS. Patient ambulatory out of treatment area with clean and steady gait. All questions answered at time of discharge. Belongings sent home with patient.

## 2023-06-09 NOTE — ED Triage Notes (Addendum)
Presents from home for swelling to L lower jaw x1 month. Has tried oral antibiotics, failed this tx and was told to present to ER.  Notes bleeding with brushing teeth. Denies difficulty swallowing.  H/o HIV

## 2023-06-09 NOTE — ED Provider Triage Note (Signed)
Emergency Medicine Provider Triage Evaluation Note  Trevor Leon , a 58 y.o. male  was evaluated in triage.  Pt complains of left lower dental mass as well as left lower jaw swelling x12m, tried OP antibiotics with no improvement, swelling is getting worse, denies pain, denies SOB or trouble speaking.  Review of Systems  Positive: Jaw swelling  Negative: Difficulty with phonation, difficulty handling secretions  Physical Exam  BP 114/77 (BP Location: Right Arm)   Pulse 90   Temp 98.3 F (36.8 C) (Oral)   Resp 18   Wt 59 kg   SpO2 96%   BMI 20.67 kg/m  Gen:   Awake, no distress   Resp:  Normal effort  MSK:   Moves extremities without difficulty  Other:    Medical Decision Making  Medically screening exam initiated at 8:59 AM.  Appropriate orders placed.  [name] was informed that the remainder of the evaluation will be completed by another provider, this initial triage assessment does not replace that evaluation, and the importance of remaining in the ED until their evaluation is complete.  History of HIV.    Smitty Knudsen, PA-C 06/09/23 0900

## 2023-06-09 NOTE — Discharge Instructions (Addendum)
You were seen in the emergency department for jaw swelling and a mass.  Test performed while you are here include blood work and CT scan.  Your CT scan showed a 6 cm left lower facial mass with erosion of mandible and involvement of the lateral aspect of the tongue concerning for a possible cancer.  Please contact our ENT physician Dr. Jenne Pane at the phone number above first thing tomorrow to arrange close follow-up and biopsy.  We have additionally provided the contact information for the community health and wellness center for you to obtain a PCP.  You can continue taking Tylenol and ibuprofen as needed for pain.  We are additionally sending in a short course of stronger pain medication that you can take as needed for breakthrough severe pain.  If you experience difficulty swallowing, voice changes, shortness of breath, inability to swallow your own spit, or any other concerns, return to the ED for reevaluation.

## 2023-06-09 NOTE — ED Provider Notes (Signed)
[place] [facility] AT [facility] Provider Note  MDM   HPI/ROS:  Trevor Leon is a 58 y.o. male with pertinent past medical history of well-controlled HIV, tobacco use disorder who presents for evaluation of left jaw swelling. Patient reports a 1 month history of left lower jaw swelling that he states initially began as a small spot on the outside of his jaw, however has subsequently stopped spread to include an area inside his mouth as well.  He additionally notes he has had bleeding of his gums when brushing his teeth.  He went to an urgent care last week and was given a course of antibiotics for possible tooth abscess which he finished 3 days ago and has not noted any improvement of symptoms.  He notes mild associated pain but primarily just swelling.  He denies any fevers, chills, night sweats, unexpected weight changes.  He reports he has still been able to tolerate p.o. intake without difficulty.  He denies any difficulty swallowing, shortness of breath, chest pain.   Physical exam is notable for: - Left lower jawline swelling and focal nonmobile mass that is minimally tender to palpation.  Erythema overlying without warmth -Mildly enlarged firm, nontender left submandibular lymph nodes  On my initial evaluation, patient is:  -Vital signs stable. Patient afebrile, hemodynamically stable, and non-toxic appearing. -Additional history obtained from review of prior records -Labs reviewed: No leukocytosis, no anemia, normal creatinine and electrolytes -Imaging reviewed: Large left lower facial mass with erosion of the mandible and involvement of the lateral aspect of the tongue.  Presentation is most concerning for malignancy.  Additionally consider infection, however felt less likely given patient afebrile without leukocytosis and s/p full course of antibiotics.  Low suspicion for Ludwig's angina based on history and exam.  Patient protecting his airway with no signs or  symptoms of respiratory distress or airway compromise, and CT demonstrating patent airway as well.  Engaged in shared decision-making with patient and sister at bedside regarding next steps given concern for possible malignancy.  Patient would like to pursue outpatient workup which I feel is reasonable given he is tolerating p.o. intake without difficulty and with no airway concerns as above.  Although he does not currently have a PCP, he follows closely with infectious disease in our health system.  Patient was given contact information for ENT as well as to obtain PCP.  Will send short course of Norco for breakthrough pain.  Patient was advised on risks of narcotic medications, particularly in older patient population.  He was additionally counseled on tobacco cessation.   Disposition:  I discussed the plan for discharge with the patient and/or their surrogate at bedside prior to discharge and they were in agreement with the plan and verbalized understanding of the return precautions provided. All questions answered to the best of my ability. Ultimately, the patient was discharged in stable condition with stable vital signs. I am reassured that they are capable of close follow up and good social support at home.   Clinical Impression:  1. Jaw swelling     Rx / DC Orders ED Discharge Orders          Ordered    HYDROcodone-acetaminophen (NORCO) 5-325 MG tablet  Every 6 hours PRN        06/09/23 1913            The plan for this patient was discussed with Dr. Rosalia Hammers, who voiced agreement and who oversaw evaluation and treatment of this  patient.   Clinical Complexity A medically appropriate history, review of systems, and physical exam was performed.  My independent interpretations of EKG, labs, and radiology are documented in the ED course above.   If decision rules were used in this patient's evaluation, they are listed below.   Patient's presentation is most consistent with acute  presentation with potential threat to life or bodily function.  Medical Decision Making Risk Prescription drug management.    HPI/ROS      See MDM section for pertinent HPI and ROS. A complete ROS was performed with pertinent positives/negatives noted above.   Past Medical History:  Diagnosis Date   Chicken pox    HIV infection (HCC)     Past Surgical History:  Procedure Laterality Date   WISDOM TOOTH EXTRACTION        Physical Exam   Vitals:   06/09/23 0839 06/09/23 0841 06/09/23 1314  BP: 114/77  138/84  Pulse: 90  83  Resp: 18  18  Temp: 98.3 F (36.8 C)  98.7 F (37.1 C)  TempSrc: Oral  Oral  SpO2: 96%  97%  Weight:  59 kg     Physical Exam Gen: NAD. Appears comfortable.  Normal voice.  Speaking in full sentences.  Tolerating secretions well. HENT: Conjunctiva clear, PERRL, EOMI. MMM.  Asymmetric left-sided facial swelling with large nonmobile mass with overlying erythema though minimal tenderness.  Mass in left lower aspect of mouth that appears to erode some of the posterior molars and extends into the posterior aspect of the tongue.  No trismus.  Normal appearance of the soft palate and floor of the mouth.  Slightly enlarged firm, nontender left submandibular lymph nodes. CV: RRR. No M/R/G Pulm: Lungs CTAB with no wheezing, rales, or rhonchi.  GI: Abdomen soft, non-tender, non-distended. Normal bowel sounds in all 4 quadrants. MSK/Skin: No lower extremity edema. Extremities warm, well-perfused with 2+ pulses in all 4 extremities. Neuro: A&Ox3. GCS 15. Moves all extremities.        Procedures   If procedures were preformed on this patient, they are listed below:  Procedures   Mikeal Hawthorne, MD Emergency Medicine PGY-2   Please note that this documentation was produced with the assistance of voice-to-text technology and may contain errors.    Mikeal Hawthorne, MD 06/10/23 Lindie Spruce    Margarita Grizzle, MD 06/10/23 [phone]

## 2023-06-20 ENCOUNTER — Other Ambulatory Visit (HOSPITAL_COMMUNITY): Payer: Self-pay | Admitting: Otolaryngology

## 2023-06-20 DIAGNOSIS — C069 Malignant neoplasm of mouth, unspecified: Secondary | ICD-10-CM

## 2023-06-20 DIAGNOSIS — F17200 Nicotine dependence, unspecified, uncomplicated: Secondary | ICD-10-CM

## 2023-06-30 ENCOUNTER — Telehealth: Payer: Self-pay | Admitting: Radiation Oncology

## 2023-06-30 NOTE — Telephone Encounter (Signed)
Called patient to schedule consultation w. Dr. Basilio Cairo. No answer, unable to LVM, VM full.

## 2023-07-01 ENCOUNTER — Encounter (HOSPITAL_COMMUNITY)
Admission: RE | Admit: 2023-07-01 | Discharge: 2023-07-01 | Disposition: A | Discharge status: Home / Self Care | Payer: Medicaid Other | Source: Ambulatory Visit | Attending: Otolaryngology | Admitting: Otolaryngology

## 2023-07-01 DIAGNOSIS — C069 Malignant neoplasm of mouth, unspecified: Secondary | ICD-10-CM | POA: Insufficient documentation

## 2023-07-01 DIAGNOSIS — F17200 Nicotine dependence, unspecified, uncomplicated: Secondary | ICD-10-CM | POA: Diagnosis present

## 2023-07-01 LAB — GLUCOSE, CAPILLARY: Glucose-Capillary: 87 mg/dL (ref 70–99)

## 2023-07-01 MED ORDER — FLUDEOXYGLUCOSE F - 18 (FDG) INJECTION
6.5000 | Freq: Once | INTRAVENOUS | Status: AC
  Administered 2023-07-01: 6.48 via INTRAVENOUS

## 2023-07-02 ENCOUNTER — Other Ambulatory Visit: Payer: Self-pay

## 2023-07-02 ENCOUNTER — Telehealth: Payer: Self-pay | Admitting: Radiation Oncology

## 2023-07-02 DIAGNOSIS — C8330 Diffuse large B-cell lymphoma, unspecified site: Secondary | ICD-10-CM

## 2023-07-02 DIAGNOSIS — C8591 Non-Hodgkin lymphoma, unspecified, lymph nodes of head, face, and neck: Secondary | ICD-10-CM

## 2023-07-02 NOTE — Progress Notes (Signed)
Received referral from [facility], RN, Head and Neck Navigator, for this patient. Reviewed the patients information with Dr Leonides Schanz, "[facility] chemotherapy with bone marrow transplant recommended. I would send him to [facility] for tertiary care treatment." Referral was faxed to Hem/Onc at [facility].

## 2023-07-02 NOTE — Telephone Encounter (Signed)
Per Hedda Slade, [facility] referral meant for Med Onc, patient does not need Rad Onc consult. Closing referral at this time.

## 2023-08-07 ENCOUNTER — Telehealth: Payer: Self-pay | Admitting: Hematology and Oncology

## 2023-08-07 ENCOUNTER — Other Ambulatory Visit: Payer: Self-pay | Admitting: Hematology and Oncology

## 2023-08-07 ENCOUNTER — Telehealth: Payer: Self-pay

## 2023-08-07 DIAGNOSIS — D701 Agranulocytosis secondary to cancer chemotherapy: Secondary | ICD-10-CM

## 2023-08-07 DIAGNOSIS — D709 Neutropenia, unspecified: Secondary | ICD-10-CM | POA: Insufficient documentation

## 2023-08-07 NOTE — Telephone Encounter (Signed)
 Completed - Patient scheduled appts and is aware of all appt details.

## 2023-08-07 NOTE — Telephone Encounter (Signed)
 Received voicemail from Jolene Provost, MD with [facility] ID requesting to speak to Dr. Luciana Axe regarding Trevor Leon who is currently hospitalized. She can be reached on her cell at [phone].  Sandie Ano, RN

## 2023-08-12 ENCOUNTER — Other Ambulatory Visit: Payer: Self-pay | Admitting: Hematology and Oncology

## 2023-08-12 DIAGNOSIS — C8591 Non-Hodgkin lymphoma, unspecified, lymph nodes of head, face, and neck: Secondary | ICD-10-CM

## 2023-08-12 DIAGNOSIS — C8330 Diffuse large B-cell lymphoma, unspecified site: Secondary | ICD-10-CM

## 2023-08-13 ENCOUNTER — Inpatient Hospital Stay: Attending: Internal Medicine

## 2023-08-13 ENCOUNTER — Inpatient Hospital Stay

## 2023-08-13 ENCOUNTER — Ambulatory Visit

## 2023-08-13 ENCOUNTER — Telehealth: Payer: Self-pay | Admitting: *Deleted

## 2023-08-13 ENCOUNTER — Other Ambulatory Visit

## 2023-08-13 ENCOUNTER — Inpatient Hospital Stay (HOSPITAL_BASED_OUTPATIENT_CLINIC_OR_DEPARTMENT_OTHER): Admitting: Hematology and Oncology

## 2023-08-13 ENCOUNTER — Ambulatory Visit: Admitting: Hematology and Oncology

## 2023-08-13 VITALS — BP 131/85 | HR 70 | Temp 98.4°F | Resp 14 | Wt 128.8 lb

## 2023-08-13 DIAGNOSIS — C8330 Diffuse large B-cell lymphoma, unspecified site: Secondary | ICD-10-CM

## 2023-08-13 LAB — LACTATE DEHYDROGENASE: LDH: 216 U/L — ABNORMAL HIGH (ref 98–192)

## 2023-08-13 LAB — CBC WITH DIFFERENTIAL (CANCER CENTER ONLY)
Abs Immature Granulocytes: 0.03 10*3/uL (ref 0.00–0.07)
Basophils Absolute: 0 10*3/uL (ref 0.0–0.1)
Basophils Relative: 0 %
Eosinophils Absolute: 0.1 10*3/uL (ref 0.0–0.5)
Eosinophils Relative: 1 %
HCT: 35 % — ABNORMAL LOW (ref 39.0–52.0)
Hemoglobin: 12.4 g/dL — ABNORMAL LOW (ref 13.0–17.0)
Immature Granulocytes: 1 %
Lymphocytes Relative: 21 %
Lymphs Abs: 1.2 10*3/uL (ref 0.7–4.0)
MCH: 37.9 pg — ABNORMAL HIGH (ref 26.0–34.0)
MCHC: 35.4 g/dL (ref 30.0–36.0)
MCV: 107 fL — ABNORMAL HIGH (ref 80.0–100.0)
Monocytes Absolute: 0.1 10*3/uL (ref 0.1–1.0)
Monocytes Relative: 2 %
Neutro Abs: 4.4 10*3/uL (ref 1.7–7.7)
Neutrophils Relative %: 75 %
Platelet Count: 166 10*3/uL (ref 150–400)
RBC: 3.27 MIL/uL — ABNORMAL LOW (ref 4.22–5.81)
RDW: 13.1 % (ref 11.5–15.5)
WBC Count: 5.8 10*3/uL (ref 4.0–10.5)
nRBC: 0 % (ref 0.0–0.2)

## 2023-08-13 LAB — SAMPLE TO BLOOD BANK

## 2023-08-13 LAB — CMP (CANCER CENTER ONLY)
ALT: 77 U/L — ABNORMAL HIGH (ref 0–44)
AST: 48 U/L — ABNORMAL HIGH (ref 15–41)
Albumin: 3.9 g/dL (ref 3.5–5.0)
Alkaline Phosphatase: 92 U/L (ref 38–126)
Anion gap: 8 (ref 5–15)
BUN: 20 mg/dL (ref 6–20)
CO2: 29 mmol/L (ref 22–32)
Calcium: 9.3 mg/dL (ref 8.9–10.3)
Chloride: 100 mmol/L (ref 98–111)
Creatinine: 0.71 mg/dL (ref 0.61–1.24)
GFR, Estimated: >60 mL/min (ref >60)
Glucose, Bld: 66 mg/dL — ABNORMAL LOW (ref 70–99)
Potassium: 3.3 mmol/L — ABNORMAL LOW (ref 3.5–5.1)
Sodium: 137 mmol/L (ref 135–145)
Total Bilirubin: 0.8 mg/dL (ref 0.0–1.2)
Total Protein: 7 g/dL (ref 6.5–8.1)

## 2023-08-13 NOTE — Telephone Encounter (Signed)
 Opened in error

## 2023-08-13 NOTE — Telephone Encounter (Signed)
 Pt arrived here for labs which were done. Met with pt and his sister. Explained that his Heme navigator @ [facility] had notified me on Monday, 08/11/23 that he would be going to the [facility] cancer center in [facility] for all his labs. Injections etc as he lives in [place]. Advised that we are happy to take care of those needs here but that it was his choice of where he does his follow up after his EPOCH, in patient treatments through [facility]. He has chosen [facility] as his follow up clinic and was vert appreciative of the information provided to him. Advised that I would cancel his clinic and injection appts her as he has them scheduled at the cancer center in [place] for tomorrow as are he other f/u appts. Provided print out of his CBC done today. Dr. Leonides Schanz made aware. Dr. Leonides Schanz

## 2023-08-13 NOTE — Telephone Encounter (Signed)
 TCT patient after hios CMP results came in. Spoke with him and advised that his blood sugar was a bit low @ 66 and his K+ was a bit low @ 3.3. Advised to eat breakfast this morning including orange juice and protein to get his blood sugar back up. Also advised to increase K+ rich foods to his diet such as bananas and V8 juice. He voiced understanding and says he has both of those foods/juice at home. Advised that I would fax these results to [facility] hem/onc clinic. No other questions or concerns.

## 2023-08-18 ENCOUNTER — Other Ambulatory Visit

## 2023-08-20 ENCOUNTER — Encounter: Payer: Self-pay | Admitting: Hematology and Oncology

## 2023-08-20 NOTE — Progress Notes (Signed)
 Patient wished to establish with a different center. Appointment cancelled.

## 2023-08-21 ENCOUNTER — Other Ambulatory Visit

## 2023-08-25 ENCOUNTER — Other Ambulatory Visit

## 2023-10-10 NOTE — Progress Notes (Signed)
 The 10-year ASCVD risk score (Arnett DK, et al., 2019) is: 6.9%   Values used to calculate the score:     Age: 58 years     Sex: Male     Is Non-Hispanic African American: No     Diabetic: No     Tobacco smoker: Yes     Systolic Blood Pressure: 103 mmHg     Is BP treated: No     HDL Cholesterol: 46 mg/dL     Total Cholesterol: 170 mg/dL  Currently prescribed pravastatin  10 mg.  Adore Kithcart, BSN, RN

## 2023-10-28 ENCOUNTER — Other Ambulatory Visit: Payer: Medicaid Other

## 2023-11-11 ENCOUNTER — Ambulatory Visit: Payer: Medicaid Other | Admitting: Internal Medicine
